# Patient Record
Sex: Female | Born: 1980
Health system: Southern US, Community
[De-identification: ages and names within clinical notes are randomized; demographics above are authoritative.]

## PROBLEM LIST (undated history)

## (undated) DIAGNOSIS — R51 Headache: Secondary | ICD-10-CM

## (undated) DIAGNOSIS — E236 Other disorders of pituitary gland: Secondary | ICD-10-CM

## (undated) DIAGNOSIS — K819 Cholecystitis, unspecified: Secondary | ICD-10-CM

## (undated) DIAGNOSIS — Z789 Other specified health status: Secondary | ICD-10-CM

## (undated) DIAGNOSIS — K219 Gastro-esophageal reflux disease without esophagitis: Secondary | ICD-10-CM

## (undated) HISTORY — PX: WISDOM TOOTH EXTRACTION: SHX21

---

## 2003-08-05 ENCOUNTER — Encounter (HOSPITAL_COMMUNITY): Admission: RE | Admit: 2003-08-05 | Discharge: 2003-09-04 | Payer: Self-pay | Admitting: Preventative Medicine

## 2003-08-25 ENCOUNTER — Ambulatory Visit (HOSPITAL_COMMUNITY): Admission: RE | Admit: 2003-08-25 | Discharge: 2003-08-25 | Payer: Self-pay | Admitting: Preventative Medicine

## 2003-10-07 ENCOUNTER — Encounter: Admission: RE | Admit: 2003-10-07 | Discharge: 2003-10-07 | Payer: Self-pay | Admitting: Neurosurgery

## 2003-10-27 ENCOUNTER — Encounter: Admission: RE | Admit: 2003-10-27 | Discharge: 2003-10-27 | Payer: Self-pay | Admitting: Neurosurgery

## 2005-01-28 ENCOUNTER — Emergency Department (HOSPITAL_COMMUNITY): Admission: EM | Admit: 2005-01-28 | Discharge: 2005-01-28 | Payer: Self-pay | Admitting: Emergency Medicine

## 2005-02-05 ENCOUNTER — Ambulatory Visit (HOSPITAL_COMMUNITY): Admission: RE | Admit: 2005-02-05 | Discharge: 2005-02-05 | Payer: Self-pay | Admitting: Internal Medicine

## 2006-04-29 ENCOUNTER — Encounter (HOSPITAL_COMMUNITY): Admission: RE | Admit: 2006-04-29 | Discharge: 2006-05-29 | Payer: Self-pay | Admitting: Internal Medicine

## 2006-05-30 ENCOUNTER — Ambulatory Visit: Admission: RE | Admit: 2006-05-30 | Discharge: 2006-05-30 | Payer: Self-pay | Admitting: Internal Medicine

## 2010-08-16 ENCOUNTER — Other Ambulatory Visit (HOSPITAL_COMMUNITY): Payer: Self-pay | Admitting: Gynecology

## 2010-08-16 DIAGNOSIS — N979 Female infertility, unspecified: Secondary | ICD-10-CM

## 2010-08-23 ENCOUNTER — Ambulatory Visit (HOSPITAL_COMMUNITY)
Admission: RE | Admit: 2010-08-23 | Discharge: 2010-08-23 | Disposition: A | Discharge status: Home / Self Care | Payer: BC Managed Care – PPO | Source: Ambulatory Visit | Attending: Gynecology | Admitting: Gynecology

## 2010-08-23 DIAGNOSIS — N979 Female infertility, unspecified: Secondary | ICD-10-CM | POA: Insufficient documentation

## 2010-08-23 MED ORDER — IOHEXOL 300 MG/ML  SOLN: 10.0000 mL | Freq: Once | INTRAMUSCULAR | Status: AC | PRN

## 2010-08-23 MED ORDER — IOHEXOL 300 MG/ML  SOLN: 10.0000 mL | Freq: Once | INTRAMUSCULAR | Status: DC | PRN

## 2010-09-06 ENCOUNTER — Other Ambulatory Visit: Payer: Self-pay | Admitting: Gynecology

## 2010-09-06 DIAGNOSIS — E349 Endocrine disorder, unspecified: Secondary | ICD-10-CM

## 2010-09-11 ENCOUNTER — Ambulatory Visit
Admission: RE | Admit: 2010-09-11 | Discharge: 2010-09-11 | Disposition: A | Discharge status: Home / Self Care | Payer: BC Managed Care – PPO | Source: Ambulatory Visit | Attending: Gynecology | Admitting: Gynecology

## 2010-09-11 DIAGNOSIS — E349 Endocrine disorder, unspecified: Secondary | ICD-10-CM

## 2010-09-11 MED ORDER — GADOBENATE DIMEGLUMINE 529 MG/ML IV SOLN
10.0000 mL | Freq: Once | INTRAVENOUS | Status: AC | PRN
  Administered 2010-09-11: 10 mL via INTRAVENOUS

## 2011-02-08 ENCOUNTER — Other Ambulatory Visit: Payer: Self-pay | Admitting: Neurosurgery

## 2011-02-08 DIAGNOSIS — D497 Neoplasm of unspecified behavior of endocrine glands and other parts of nervous system: Secondary | ICD-10-CM

## 2011-02-21 ENCOUNTER — Ambulatory Visit
Admission: RE | Admit: 2011-02-21 | Discharge: 2011-02-21 | Disposition: A | Discharge status: Home / Self Care | Payer: BC Managed Care – PPO | Source: Ambulatory Visit | Attending: Neurosurgery | Admitting: Neurosurgery

## 2011-02-21 DIAGNOSIS — D497 Neoplasm of unspecified behavior of endocrine glands and other parts of nervous system: Secondary | ICD-10-CM

## 2011-02-21 MED ORDER — GADOBENATE DIMEGLUMINE 529 MG/ML IV SOLN
10.0000 mL | Freq: Once | INTRAVENOUS | Status: AC | PRN
  Administered 2011-02-21: 10 mL via INTRAVENOUS

## 2011-05-29 LAB — OB RESULTS CONSOLE HEPATITIS B SURFACE ANTIGEN: Hepatitis B Surface Ag: NEGATIVE

## 2011-05-29 LAB — OB RESULTS CONSOLE ANTIBODY SCREEN: Antibody Screen: NEGATIVE

## 2011-05-29 LAB — OB RESULTS CONSOLE ABO/RH

## 2011-05-29 LAB — OB RESULTS CONSOLE GC/CHLAMYDIA
Chlamydia: NEGATIVE
Gonorrhea: NEGATIVE

## 2011-12-03 LAB — OB RESULTS CONSOLE GBS: GBS: NEGATIVE

## 2011-12-19 ENCOUNTER — Encounter (HOSPITAL_COMMUNITY): Payer: Self-pay | Admitting: Anesthesiology

## 2011-12-19 ENCOUNTER — Encounter (HOSPITAL_COMMUNITY)
Admission: AD | Disposition: A | Discharge status: Home / Self Care | Payer: Self-pay | Source: Ambulatory Visit | Attending: Obstetrics and Gynecology

## 2011-12-19 ENCOUNTER — Encounter (HOSPITAL_COMMUNITY): Payer: Self-pay | Admitting: *Deleted

## 2011-12-19 ENCOUNTER — Inpatient Hospital Stay (HOSPITAL_COMMUNITY)
Admission: AD | Admit: 2011-12-19 | Discharge: 2011-12-22 | DRG: 766 | Disposition: A | Discharge status: Home / Self Care | Payer: 59 | Source: Ambulatory Visit | Attending: Obstetrics and Gynecology | Admitting: Obstetrics and Gynecology

## 2011-12-19 ENCOUNTER — Inpatient Hospital Stay (HOSPITAL_COMMUNITY): Payer: 59 | Admitting: Anesthesiology

## 2011-12-19 HISTORY — DX: Other specified health status: Z78.9

## 2011-12-19 LAB — RUBELLA SCREEN: Rubella: 115.8 IU/mL — ABNORMAL HIGH

## 2011-12-19 LAB — CBC
Platelets: 216 10*3/uL (ref 150–400)
RBC: 3.88 MIL/uL (ref 3.87–5.11)
RDW: 14.6 % (ref 11.5–15.5)
WBC: 8.5 10*3/uL (ref 4.0–10.5)

## 2011-12-19 SURGERY — Surgical Case
Anesthesia: Regional

## 2011-12-19 MED ORDER — ONDANSETRON HCL 4 MG/2ML IJ SOLN
INTRAMUSCULAR | Status: AC
  Filled 2011-12-19: qty 2

## 2011-12-19 MED ORDER — LACTATED RINGERS IV SOLN
500.0000 mL | Freq: Once | INTRAVENOUS | Status: AC
  Administered 2011-12-19: 15:00:00 via INTRAVENOUS

## 2011-12-19 MED ORDER — CITRIC ACID-SODIUM CITRATE 334-500 MG/5ML PO SOLN
30.0000 mL | ORAL | Status: DC | PRN
  Administered 2011-12-19: 30 mL via ORAL
  Filled 2011-12-19: qty 15

## 2011-12-19 MED ORDER — SODIUM BICARBONATE 8.4 % IV SOLN
INTRAVENOUS | Status: AC
  Filled 2011-12-19: qty 50

## 2011-12-19 MED ORDER — EPHEDRINE 5 MG/ML INJ: 10.0000 mg | INTRAVENOUS | Status: DC | PRN

## 2011-12-19 MED ORDER — IBUPROFEN 600 MG PO TABS: 600.0000 mg | ORAL_TABLET | Freq: Four times a day (QID) | ORAL | Status: DC | PRN

## 2011-12-19 MED ORDER — OXYTOCIN 40 UNITS IN LACTATED RINGERS INFUSION - SIMPLE MED: 62.5000 mL/h | INTRAVENOUS | Status: DC

## 2011-12-19 MED ORDER — DIPHENHYDRAMINE HCL 50 MG/ML IJ SOLN: 12.5000 mg | INTRAMUSCULAR | Status: DC | PRN

## 2011-12-19 MED ORDER — DIPHENHYDRAMINE HCL 50 MG/ML IJ SOLN: 25.0000 mg | INTRAMUSCULAR | Status: DC | PRN

## 2011-12-19 MED ORDER — ACETAMINOPHEN 10 MG/ML IV SOLN
1000.0000 mg | Freq: Four times a day (QID) | INTRAVENOUS | Status: DC | PRN
  Filled 2011-12-19: qty 100

## 2011-12-19 MED ORDER — TERBUTALINE SULFATE 1 MG/ML IJ SOLN: 0.2500 mg | Freq: Once | INTRAMUSCULAR | Status: DC | PRN

## 2011-12-19 MED ORDER — EPHEDRINE 5 MG/ML INJ
INTRAVENOUS | Status: AC
  Filled 2011-12-19: qty 10

## 2011-12-19 MED ORDER — LACTATED RINGERS IV SOLN
INTRAVENOUS | Status: DC | PRN
  Administered 2011-12-19: 23:00:00 via INTRAVENOUS

## 2011-12-19 MED ORDER — OXYTOCIN 10 UNIT/ML IJ SOLN
40.0000 [IU] | INTRAVENOUS | Status: DC | PRN
  Administered 2011-12-19: 40 [IU] via INTRAVENOUS

## 2011-12-19 MED ORDER — OXYTOCIN BOLUS FROM INFUSION
500.0000 mL | INTRAVENOUS | Status: DC
  Filled 2011-12-19 (×57): qty 500

## 2011-12-19 MED ORDER — NALOXONE HCL 0.4 MG/ML IJ SOLN: 0.4000 mg | INTRAMUSCULAR | Status: DC | PRN

## 2011-12-19 MED ORDER — MEPERIDINE HCL 25 MG/ML IJ SOLN
INTRAMUSCULAR | Status: DC | PRN
  Administered 2011-12-19: 25 mg via INTRAVENOUS

## 2011-12-19 MED ORDER — KETOROLAC TROMETHAMINE 30 MG/ML IJ SOLN
30.0000 mg | Freq: Four times a day (QID) | INTRAMUSCULAR | Status: DC | PRN
  Administered 2011-12-20: 30 mg via INTRAVENOUS

## 2011-12-19 MED ORDER — NALBUPHINE HCL 10 MG/ML IJ SOLN
5.0000 mg | INTRAMUSCULAR | Status: DC | PRN
Start: 2011-12-19 — End: 2011-12-20
  Filled 2011-12-19: qty 1

## 2011-12-19 MED ORDER — EPHEDRINE 5 MG/ML INJ
10.0000 mg | INTRAVENOUS | Status: DC | PRN
  Filled 2011-12-19: qty 4

## 2011-12-19 MED ORDER — MORPHINE SULFATE (PF) 0.5 MG/ML IJ SOLN
INTRAMUSCULAR | Status: DC | PRN
  Administered 2011-12-19: 4 mg via EPIDURAL

## 2011-12-19 MED ORDER — MEPERIDINE HCL 25 MG/ML IJ SOLN: 6.2500 mg | INTRAMUSCULAR | Status: DC | PRN

## 2011-12-19 MED ORDER — FENTANYL CITRATE 0.05 MG/ML IJ SOLN: 25.0000 ug | INTRAMUSCULAR | Status: DC | PRN

## 2011-12-19 MED ORDER — SCOPOLAMINE 1 MG/3DAYS TD PT72
1.0000 | MEDICATED_PATCH | Freq: Once | TRANSDERMAL | Status: DC
  Administered 2011-12-20: 1.5 mg via TRANSDERMAL

## 2011-12-19 MED ORDER — FENTANYL 2.5 MCG/ML BUPIVACAINE 1/10 % EPIDURAL INFUSION (WH - ANES)
INTRAMUSCULAR | Status: DC | PRN
  Administered 2011-12-19: 14 mL/h via EPIDURAL

## 2011-12-19 MED ORDER — SODIUM BICARBONATE 8.4 % IV SOLN
INTRAVENOUS | Status: DC | PRN
  Administered 2011-12-19: 5 mL via EPIDURAL

## 2011-12-19 MED ORDER — PROMETHAZINE HCL 25 MG/ML IJ SOLN: 6.2500 mg | INTRAMUSCULAR | Status: DC | PRN

## 2011-12-19 MED ORDER — KETOROLAC TROMETHAMINE 30 MG/ML IJ SOLN: 30.0000 mg | Freq: Four times a day (QID) | INTRAMUSCULAR | Status: DC | PRN

## 2011-12-19 MED ORDER — CEFAZOLIN SODIUM-DEXTROSE 2-3 GM-% IV SOLR
INTRAVENOUS | Status: DC | PRN
  Administered 2011-12-19: 2 g via INTRAVENOUS

## 2011-12-19 MED ORDER — OXYCODONE-ACETAMINOPHEN 5-325 MG PO TABS: 1.0000 | ORAL_TABLET | ORAL | Status: DC | PRN

## 2011-12-19 MED ORDER — METOCLOPRAMIDE HCL 5 MG/ML IJ SOLN
10.0000 mg | Freq: Three times a day (TID) | INTRAMUSCULAR | Status: DC | PRN
Start: 2011-12-19 — End: 2011-12-20

## 2011-12-19 MED ORDER — OXYTOCIN 40 UNITS IN LACTATED RINGERS INFUSION - SIMPLE MED
1.0000 m[IU]/min | INTRAVENOUS | Status: DC
  Administered 2011-12-19: 26 m[IU]/min via INTRAVENOUS
  Administered 2011-12-19: 2 m[IU]/min via INTRAVENOUS
  Filled 2011-12-19: qty 1000

## 2011-12-19 MED ORDER — MORPHINE SULFATE 0.5 MG/ML IJ SOLN
INTRAMUSCULAR | Status: AC
  Filled 2011-12-19: qty 10

## 2011-12-19 MED ORDER — LACTATED RINGERS IV SOLN
INTRAVENOUS | Status: DC
  Administered 2011-12-19 (×2): via INTRAVENOUS

## 2011-12-19 MED ORDER — ONDANSETRON HCL 4 MG/2ML IJ SOLN: 4.0000 mg | Freq: Four times a day (QID) | INTRAMUSCULAR | Status: DC | PRN

## 2011-12-19 MED ORDER — MEPERIDINE HCL 25 MG/ML IJ SOLN
INTRAMUSCULAR | Status: AC
  Filled 2011-12-19: qty 1

## 2011-12-19 MED ORDER — ONDANSETRON HCL 4 MG/2ML IJ SOLN: 4.0000 mg | Freq: Three times a day (TID) | INTRAMUSCULAR | Status: DC | PRN

## 2011-12-19 MED ORDER — ACETAMINOPHEN 325 MG PO TABS: 650.0000 mg | ORAL_TABLET | ORAL | Status: DC | PRN

## 2011-12-19 MED ORDER — ONDANSETRON HCL 4 MG/2ML IJ SOLN
INTRAMUSCULAR | Status: DC | PRN
  Administered 2011-12-19: 4 mg via INTRAVENOUS

## 2011-12-19 MED ORDER — DIPHENHYDRAMINE HCL 25 MG PO CAPS
25.0000 mg | ORAL_CAPSULE | ORAL | Status: DC | PRN
  Filled 2011-12-19: qty 1

## 2011-12-19 MED ORDER — PHENYLEPHRINE 40 MCG/ML (10ML) SYRINGE FOR IV PUSH (FOR BLOOD PRESSURE SUPPORT)
80.0000 ug | PREFILLED_SYRINGE | INTRAVENOUS | Status: DC | PRN
  Filled 2011-12-19: qty 5

## 2011-12-19 MED ORDER — SODIUM CHLORIDE 0.9 % IV SOLN
1.0000 ug/kg/h | INTRAVENOUS | Status: DC | PRN
  Filled 2011-12-19: qty 2.5

## 2011-12-19 MED ORDER — PHENYLEPHRINE 40 MCG/ML (10ML) SYRINGE FOR IV PUSH (FOR BLOOD PRESSURE SUPPORT): 80.0000 ug | PREFILLED_SYRINGE | INTRAVENOUS | Status: DC | PRN

## 2011-12-19 MED ORDER — MIDAZOLAM HCL 2 MG/2ML IJ SOLN: 0.5000 mg | Freq: Once | INTRAMUSCULAR | Status: AC | PRN

## 2011-12-19 MED ORDER — SODIUM CHLORIDE 0.9 % IJ SOLN: 3.0000 mL | INTRAMUSCULAR | Status: DC | PRN

## 2011-12-19 MED ORDER — OXYTOCIN 10 UNIT/ML IJ SOLN
INTRAMUSCULAR | Status: AC
  Filled 2011-12-19: qty 1

## 2011-12-19 MED ORDER — PHENYLEPHRINE 40 MCG/ML (10ML) SYRINGE FOR IV PUSH (FOR BLOOD PRESSURE SUPPORT)
PREFILLED_SYRINGE | INTRAVENOUS | Status: AC
  Filled 2011-12-19: qty 5

## 2011-12-19 MED ORDER — LIDOCAINE HCL (PF) 1 % IJ SOLN: 30.0000 mL | INTRAMUSCULAR | Status: DC | PRN

## 2011-12-19 MED ORDER — CEFAZOLIN SODIUM-DEXTROSE 2-3 GM-% IV SOLR: 2.0000 g | INTRAVENOUS | Status: DC

## 2011-12-19 MED ORDER — LIDOCAINE-EPINEPHRINE (PF) 2 %-1:200000 IJ SOLN
INTRAMUSCULAR | Status: AC
  Filled 2011-12-19: qty 20

## 2011-12-19 MED ORDER — LACTATED RINGERS IV SOLN: 500.0000 mL | INTRAVENOUS | Status: DC | PRN

## 2011-12-19 MED ORDER — FENTANYL 2.5 MCG/ML BUPIVACAINE 1/10 % EPIDURAL INFUSION (WH - ANES)
14.0000 mL/h | INTRAMUSCULAR | Status: DC
  Filled 2011-12-19: qty 125

## 2011-12-19 SURGICAL SUPPLY — 31 items
BARRIER ADHS 3X4 INTERCEED (GAUZE/BANDAGES/DRESSINGS) IMPLANT
BRR ADH 4X3 ABS CNTRL BYND (GAUZE/BANDAGES/DRESSINGS)
CLOTH BEACON ORANGE TIMEOUT ST (SAFETY) ×2 IMPLANT
CONTAINER PREFILL 10% NBF 15ML (MISCELLANEOUS) IMPLANT
DRAPE SURG 17X23 STRL (DRAPES) ×2 IMPLANT
DRSG COVADERM 4X10 (GAUZE/BANDAGES/DRESSINGS) ×1 IMPLANT
DURAPREP 26ML APPLICATOR (WOUND CARE) ×2 IMPLANT
ELECT REM PT RETURN 9FT ADLT (ELECTROSURGICAL) ×2
ELECTRODE REM PT RTRN 9FT ADLT (ELECTROSURGICAL) ×1 IMPLANT
EXTRACTOR VACUUM M CUP 4 TUBE (SUCTIONS) IMPLANT
GLOVE BIO SURGEON STRL SZ 6.5 (GLOVE) ×4 IMPLANT
GOWN PREVENTION PLUS LG XLONG (DISPOSABLE) ×6 IMPLANT
KIT ABG SYR 3ML LUER SLIP (SYRINGE) IMPLANT
NDL HYPO 25X5/8 SAFETYGLIDE (NEEDLE) ×1 IMPLANT
NEEDLE HYPO 22GX1.5 SAFETY (NEEDLE) IMPLANT
NEEDLE HYPO 25X5/8 SAFETYGLIDE (NEEDLE) ×2 IMPLANT
NS IRRIG 1000ML POUR BTL (IV SOLUTION) ×2 IMPLANT
PACK C SECTION WH (CUSTOM PROCEDURE TRAY) ×2 IMPLANT
PAD OB MATERNITY 4.3X12.25 (PERSONAL CARE ITEMS) IMPLANT
SLEEVE SCD COMPRESS KNEE MED (MISCELLANEOUS) IMPLANT
STAPLER VISISTAT 35W (STAPLE) IMPLANT
SUT CHROMIC 0 CTX 36 (SUTURE) ×4 IMPLANT
SUT PLAIN 0 NONE (SUTURE) IMPLANT
SUT PLAIN 2 0 XLH (SUTURE) IMPLANT
SUT VIC AB 0 CT1 27 (SUTURE) ×6
SUT VIC AB 0 CT1 27XBRD ANBCTR (SUTURE) ×3 IMPLANT
SUT VIC AB 4-0 KS 27 (SUTURE) IMPLANT
SYR CONTROL 10ML LL (SYRINGE) IMPLANT
TOWEL OR 17X24 6PK STRL BLUE (TOWEL DISPOSABLE) ×4 IMPLANT
TRAY FOLEY CATH 14FR (SET/KITS/TRAYS/PACK) ×2 IMPLANT
WATER STERILE IRR 1000ML POUR (IV SOLUTION) ×2 IMPLANT

## 2011-12-19 NOTE — Progress Notes (Signed)
Patient is in good labor pattern  Over 200 [place] units Cervix is unchanged for past 10 hours  Recommend Primary LTCS Risks reviewed with the patient Consent signed.

## 2011-12-19 NOTE — Transfer of Care (Signed)
Immediate Anesthesia Transfer of Care Note  Patient: Pamela Mathis  Procedure(s) Performed: Procedure(s) (LRB) with comments: CESAREAN SECTION (N/A)  Patient Location: PACU  Anesthesia Type: Epidural  Level of Consciousness: awake, alert , oriented and patient cooperative  Airway & Oxygen Therapy: Patient Spontanous Breathing  Post-op Assessment: Report given to PACU RN, Post -op Vital signs reviewed and stable and Patient moving all extremities  Post vital signs: Reviewed and stable  Complications: No apparent anesthesia complications

## 2011-12-19 NOTE — MAU Note (Signed)
Reports leaking clear fluid, still coming, started at 0715.  No pain, little blood streaking. No problems with preg.

## 2011-12-19 NOTE — Brief Op Note (Signed)
12/19/2011  11:29 PM  PATIENT:  Pamela Mathis  31 y.o. female  PRE-OPERATIVE DIAGNOSIS:  Failure to Progress, IUP at 38 w 4 days  POST-OPERATIVE DIAGNOSIS:  Failure to Progress, IUP at 38 w 4 days  PROCEDURE:  Procedure(s) (LRB) with comments: CESAREAN SECTION (N/A) Primary Low Transverse SURGEON:  Surgeon(s) and Role:    * Jeani Hawking, MD - Primary  PHYSICIAN ASSISTANT:   ASSISTANTS: none   ANESTHESIA:   epidural  EBL:  Total I/O In: -  Out: 300 [Urine:300]  BLOOD ADMINISTERED:none  DRAINS: Urinary Catheter (Foley)   LOCAL MEDICATIONS USED:  NONE  SPECIMEN:  No Specimen  DISPOSITION OF SPECIMEN:  N/A  COUNTS:  YES  TOURNIQUET:  * No tourniquets in log *  DICTATION: .Other Dictation: Dictation Number [id number]  PLAN OF CARE: Admit to inpatient   PATIENT DISPOSITION:  PACU - hemodynamically stable.   Delay start of Pharmacological VTE agent (>24hrs) due to surgical blood loss or risk of bleeding: not applicable

## 2011-12-19 NOTE — Anesthesia Procedure Notes (Signed)

## 2011-12-19 NOTE — Anesthesia Preprocedure Evaluation (Signed)
Anesthesia Evaluation  Patient identified by MRN, date of birth, ID band Patient awake    Reviewed: Allergy & Precautions, H&P , Patient's Chart, lab work & pertinent test results  Airway Mallampati: II  TM Distance: >3 FB Neck ROM: full    Dental  (+) Teeth Intact   Pulmonary  breath sounds clear to auscultation        Cardiovascular Rhythm:regular Rate:Normal     Neuro/Psych    GI/Hepatic GERD-  Medicated,  Endo/Other  Morbid obesity  Renal/GU      Musculoskeletal   Abdominal   Peds  Hematology   Anesthesia Other Findings       Reproductive/Obstetrics (+) Pregnancy                             Anesthesia Physical Anesthesia Plan  ASA: III  Anesthesia Plan: Epidural   Post-op Pain Management:    Induction:   Airway Management Planned:   Additional Equipment:   Intra-op Plan:   Post-operative Plan:   Informed Consent: I have reviewed the patients History and Physical, chart, labs and discussed the procedure including the risks, benefits and alternatives for the proposed anesthesia with the patient or authorized representative who has indicated his/her understanding and acceptance.   Dental Advisory Given  Plan Discussed with:   Anesthesia Plan Comments: (Labs checked- platelets confirmed with RN in room. Fetal heart tracing, per RN, reported to be stable enough for sitting procedure. Discussed epidural, and patient consents to the procedure:  included risk of possible headache,backache, failed block, allergic reaction, and nerve injury. This patient was asked if she had any questions or concerns before the procedure started.)        Anesthesia Quick Evaluation  

## 2011-12-19 NOTE — H&P (Signed)
31 year old G 1 P 0 at term presents with srom at 0715.  PNC uncomplicated See her [place] GBBS is negative  Afebrile Vital signs stable General alert and oriented Lung CTAB Car RRR Abdomen is soft and non tender Cervix is 2 cm dilated and vertex SROM confirmed by L and D nurse  Impression: Srom  PLAN: Admit Pitocin augmentation

## 2011-12-20 ENCOUNTER — Encounter (HOSPITAL_COMMUNITY): Payer: Self-pay | Admitting: *Deleted

## 2011-12-20 LAB — CBC
Hemoglobin: 8 g/dL — ABNORMAL LOW (ref 12.0–15.0)
MCH: 27.1 pg (ref 26.0–34.0)
Platelets: 157 10*3/uL (ref 150–400)
RBC: 2.95 MIL/uL — ABNORMAL LOW (ref 3.87–5.11)
WBC: 11.3 10*3/uL — ABNORMAL HIGH (ref 4.0–10.5)

## 2011-12-20 MED ORDER — DIBUCAINE 1 % RE OINT: 1.0000 "application " | TOPICAL_OINTMENT | RECTAL | Status: DC | PRN

## 2011-12-20 MED ORDER — SIMETHICONE 80 MG PO CHEW: 80.0000 mg | CHEWABLE_TABLET | ORAL | Status: DC | PRN

## 2011-12-20 MED ORDER — ONDANSETRON HCL 4 MG/2ML IJ SOLN: 4.0000 mg | INTRAMUSCULAR | Status: DC | PRN

## 2011-12-20 MED ORDER — MENTHOL 3 MG MT LOZG: 1.0000 | LOZENGE | OROMUCOSAL | Status: DC | PRN

## 2011-12-20 MED ORDER — OXYTOCIN 40 UNITS IN LACTATED RINGERS INFUSION - SIMPLE MED: 62.5000 mL/h | INTRAVENOUS | Status: AC

## 2011-12-20 MED ORDER — TETANUS-DIPHTH-ACELL PERTUSSIS 5-2.5-18.5 LF-MCG/0.5 IM SUSP: 0.5000 mL | Freq: Once | INTRAMUSCULAR | Status: DC

## 2011-12-20 MED ORDER — SENNOSIDES-DOCUSATE SODIUM 8.6-50 MG PO TABS
2.0000 | ORAL_TABLET | Freq: Every day | ORAL | Status: DC
  Administered 2011-12-20 – 2011-12-21 (×2): 2 via ORAL

## 2011-12-20 MED ORDER — PRENATAL MULTIVITAMIN CH
1.0000 | ORAL_TABLET | Freq: Every day | ORAL | Status: DC
  Administered 2011-12-20 – 2011-12-22 (×3): 1 via ORAL
  Filled 2011-12-20 (×3): qty 1

## 2011-12-20 MED ORDER — SIMETHICONE 80 MG PO CHEW
80.0000 mg | CHEWABLE_TABLET | Freq: Three times a day (TID) | ORAL | Status: DC
  Administered 2011-12-20 – 2011-12-22 (×8): 80 mg via ORAL

## 2011-12-20 MED ORDER — LACTATED RINGERS IV BOLUS (SEPSIS): 500.0000 mL | Freq: Once | INTRAVENOUS | Status: DC

## 2011-12-20 MED ORDER — IBUPROFEN 600 MG PO TABS
600.0000 mg | ORAL_TABLET | Freq: Four times a day (QID) | ORAL | Status: DC
  Administered 2011-12-20 – 2011-12-22 (×10): 600 mg via ORAL
  Filled 2011-12-20 (×10): qty 1

## 2011-12-20 MED ORDER — OXYCODONE-ACETAMINOPHEN 5-325 MG PO TABS: 1.0000 | ORAL_TABLET | ORAL | Status: DC | PRN

## 2011-12-20 MED ORDER — LANOLIN HYDROUS EX OINT: 1.0000 "application " | TOPICAL_OINTMENT | CUTANEOUS | Status: DC | PRN

## 2011-12-20 MED ORDER — ZOLPIDEM TARTRATE 5 MG PO TABS: 5.0000 mg | ORAL_TABLET | Freq: Every evening | ORAL | Status: DC | PRN

## 2011-12-20 MED ORDER — KETOROLAC TROMETHAMINE 30 MG/ML IJ SOLN
INTRAMUSCULAR | Status: AC
  Filled 2011-12-20: qty 1

## 2011-12-20 MED ORDER — WITCH HAZEL-GLYCERIN EX PADS: 1.0000 "application " | MEDICATED_PAD | CUTANEOUS | Status: DC | PRN

## 2011-12-20 MED ORDER — LACTATED RINGERS IV SOLN
INTRAVENOUS | Status: DC
  Administered 2011-12-20 (×2): via INTRAVENOUS

## 2011-12-20 MED ORDER — SCOPOLAMINE 1 MG/3DAYS TD PT72
MEDICATED_PATCH | TRANSDERMAL | Status: AC
  Filled 2011-12-20: qty 1

## 2011-12-20 MED ORDER — DIPHENHYDRAMINE HCL 25 MG PO CAPS
25.0000 mg | ORAL_CAPSULE | Freq: Four times a day (QID) | ORAL | Status: DC | PRN
  Administered 2011-12-21: 25 mg via ORAL
  Filled 2011-12-20: qty 1

## 2011-12-20 MED ORDER — ONDANSETRON HCL 4 MG PO TABS: 4.0000 mg | ORAL_TABLET | ORAL | Status: DC | PRN

## 2011-12-20 NOTE — Addendum Note (Signed)
Addendum  created 12/20/11 0803 by Jhonnie Garner, CRNA   Modules edited:Notes Section

## 2011-12-20 NOTE — Progress Notes (Signed)
Patient output 85ml in 3.5 hours. Dr. Vincente Poli notified by telephone. Orders received for LR bolus.

## 2011-12-20 NOTE — Progress Notes (Signed)
Post Partum Day 1 Subjective: no complaints and tolerating PO  Objective: Blood pressure 101/54, pulse 75, temperature 97.6 F (36.4 C), temperature source Oral, resp. rate 20, height 5\' 6"  (1.676 m), weight 111.131 kg (245 lb), last menstrual period 08/13/2010, SpO2 97.00%, unknown if currently breastfeeding.  Physical Exam:  General: alert and cooperative Lochia: appropriate Uterine Fundus: firm Incision: abd dressing CDI DVT Evaluation: No evidence of DVT seen on physical exam. No significant calf/ankle edema. Urine output slightly low, urine clear  Basename 12/20/11 0530 12/19/11 1045  HGB 8.0* 10.4*  HCT 24.3* 32.0*    Assessment/Plan: S/p LTCS  continue IV fluids  And encourage po fluids  LOS: 1 day   Marla Pouliot G 12/20/2011, 8:22 AM

## 2011-12-20 NOTE — Anesthesia Postprocedure Evaluation (Signed)
Anesthesia Post Note  Patient: Pamela Mathis  Procedure(s) Performed: Procedure(s) (LRB): CESAREAN SECTION (N/A)  Anesthesia type: Epidural  Patient location: Mother/Baby  Post pain: Pain level controlled  Post assessment: Post-op Vital signs reviewed  Last Vitals:  Filed Vitals:   12/20/11 0609  BP: 101/54  Pulse: 75  Temp: 36.4 C  Resp:     Post vital signs: Reviewed  Level of consciousness: awake  Complications: No apparent anesthesia complications

## 2011-12-20 NOTE — Op Note (Signed)
Pamela Mathis, Pamela Mathis                ACCOUNT NO.:  [id number]  MEDICAL RECORD NO.:  [id number]  LOCATION:  9143                          FACILITY:  [facility]  PHYSICIAN:  Lior Hoen L. Kartier Bennison, M.D.DATE OF BIRTH:  1980/12/31  DATE OF PROCEDURE:  12/19/2011 DATE OF DISCHARGE:                              OPERATIVE REPORT   PREOPERATIVE DIAGNOSIS:  Intrauterine pregnancy at 84 and 4, and failure to progress.  POSTOPERATIVE DIAGNOSIS:  Intrauterine pregnancy at 38 and 4, and failure to progress.  PROCEDURE:  Primary low transverse cesarean section.  SURGEON:  Danitra Payano L. Vincente Poli, M.D.  ANESTHESIA:  Epidural.  EBL:  Less than 500 mL.  COMPLICATIONS:  None.  PROCEDURE:  The patient was taken to the operating room.  She was then prepped and draped.  A Foley catheter had been inserted while on labor and delivery.  A low transverse incision was made, carried down to the fascia.  Fascia scored in the midline and extended laterally.  Rectus muscles were separated in the midline.  The peritoneum was entered bluntly.  The peritoneal incision was then stretched.  The bladder blade was inserted.  The lower uterine segment was identified and the bladder flap was created sharply and then digitally.  The bladder blade was then readjusted.  A low transverse incision was made in the uterus.  The uterus was entered using a hemostat.  Amniotic fluid was clear.  The baby was in cephalic presentation, was delivered easily with a 1 gentle pull of the vacuum without a pop off.  The baby was a female infant and was vigorous in the OR.  The neonatal team was present.  After the cord was clamped and cut, the placenta was manually removed, noted to be normal intact with a three-vessel cord.  The uterus was exteriorized and cleared of all clots and debris.  The adnexa were normal.  The uterine incision was closed in 1 layer using 0 chromic in a running locked stitch.  Hemostasis was excellent.  Uterus was returned  to the abdomen. Irrigation performed.  Hemostasis was again noted.  The peritoneum was closed using 0 Vicryl.  The rectus muscles were closed using 0 Vicryl. The fascia was closed using 0 Vicryl running stitch.  After irrigation, subcutaneous layer and noting hemostasis, the skin was closed with staples.  All sponge, lap, and instrument counts were correct x2.  The patient went to recovery room in stable condition.    Sharvil Hoey L. Vincente Poli, M.D.    Florestine Avers  D:  12/19/2011  T:  12/20/2011  Job:  [id number]

## 2011-12-20 NOTE — Anesthesia Postprocedure Evaluation (Signed)
Anesthesia Post Note  Patient: Pamela Mathis  Procedure(s) Performed: Procedure(s) (LRB): CESAREAN SECTION (N/A)  Anesthesia type: Epidural  Patient location: PACU  Post pain: Pain level controlled  Post assessment: Post-op Vital signs reviewed  Last Vitals:  Filed Vitals:   12/19/11 2349  BP: 113/53  Pulse: 88  Temp: 37.2 C  Resp: 20    Post vital signs: Reviewed  Level of consciousness: awake  Complications: No apparent anesthesia complications

## 2011-12-21 ENCOUNTER — Encounter (HOSPITAL_COMMUNITY): Payer: Self-pay | Admitting: *Deleted

## 2011-12-21 NOTE — Progress Notes (Signed)
Subjective: Postpartum Day 2: Cesarean Delivery Patient reports incisional pain, tolerating PO, + flatus and no problems voiding.    Objective: Vital signs in last 24 hours: Temp:  [97.3 F (36.3 C)-98.3 F (36.8 C)] 97.6 F (36.4 C) (10/26 0623) Pulse Rate:  [67-85] 83  (10/26 0623) Resp:  [18-20] 18  (10/26 0623) BP: (84-106)/(44-68) 98/63 mmHg (10/26 0623) SpO2:  [96 %-98 %] 98 % (10/25 1750)  Physical Exam:  General: alert and cooperative Lochia: appropriate Uterine Fundus: firm Incision: healing well, no significant drainage DVT Evaluation: No evidence of DVT seen on physical exam.   Basename 12/20/11 0530 12/19/11 1045  HGB 8.0* 10.4*  HCT 24.3* 32.0*    Assessment/Plan: Status post Cesarean section. Doing well postoperatively.  Continue current care.  Gabbie Marzo 12/21/2011, 9:59 AM

## 2011-12-22 MED ORDER — IBUPROFEN 600 MG PO TABS: 600.0000 mg | ORAL_TABLET | Freq: Four times a day (QID) | ORAL | Status: DC

## 2011-12-22 MED ORDER — OXYCODONE-ACETAMINOPHEN 5-325 MG PO TABS: 1.0000 | ORAL_TABLET | ORAL | Status: DC | PRN

## 2011-12-22 NOTE — Discharge Summary (Signed)
Obstetric Discharge Summary Reason for Admission: rupture of membranes Prenatal Procedures: ultrasound Intrapartum Procedures: cesarean: low cervical, transverse Postpartum Procedures: none Complications-Operative and Postpartum: none Hemoglobin  Date Value Range Status  12/20/2011 8.0* 12.0 - 15.0 g/dL Final     REPEATED TO VERIFY     DELTA CHECK NOTED     HCT  Date Value Range Status  12/20/2011 24.3* 36.0 - 46.0 % Final    Physical Exam:  General: alert and cooperative Lochia: appropriate Uterine Fundus: firm Incision: healing well, no significant drainage DVT Evaluation: No evidence of DVT seen on physical exam.  Discharge Diagnoses: Term Pregnancy-delivered  Discharge Information: Date: 12/22/2011 Activity: pelvic rest Diet: routine Medications: PNV, Ibuprofen and Percocet Condition: stable Instructions: refer to practice specific booklet Discharge to: home Follow-up Information    Schedule an appointment as soon as possible for a visit in 3 days to follow up.         Newborn Data: Live born female  Birth Weight: 7 lb 2.8 oz (3255 g) APGAR: 8, 9  Home with mother.  Zyshonne Malecha 12/22/2011, 7:08 AM

## 2011-12-22 NOTE — Plan of Care (Signed)
Problem: Discharge Progression Outcomes Goal: Remove staples per MD order Outcome: Adequate for Discharge Pt. To return to office within 5 days for staple removal.

## 2011-12-23 ENCOUNTER — Encounter (HOSPITAL_COMMUNITY): Payer: Self-pay | Admitting: Obstetrics and Gynecology

## 2012-03-09 ENCOUNTER — Other Ambulatory Visit: Payer: Self-pay | Admitting: Neurosurgery

## 2012-03-09 DIAGNOSIS — D353 Benign neoplasm of craniopharyngeal duct: Secondary | ICD-10-CM

## 2012-05-11 ENCOUNTER — Other Ambulatory Visit: Payer: BC Managed Care – PPO

## 2012-05-14 ENCOUNTER — Ambulatory Visit
Admission: RE | Admit: 2012-05-14 | Discharge: 2012-05-14 | Disposition: A | Discharge status: Home / Self Care | Payer: 59 | Source: Ambulatory Visit | Attending: Neurosurgery | Admitting: Neurosurgery

## 2012-05-14 MED ORDER — GADOBENATE DIMEGLUMINE 529 MG/ML IV SOLN
10.0000 mL | Freq: Once | INTRAVENOUS | Status: AC | PRN
  Administered 2012-05-14: 10 mL via INTRAVENOUS

## 2012-08-10 DIAGNOSIS — W57XXXA Bitten or stung by nonvenomous insect and other nonvenomous arthropods, initial encounter: Secondary | ICD-10-CM | POA: Insufficient documentation

## 2012-08-13 ENCOUNTER — Emergency Department (HOSPITAL_COMMUNITY)
Admission: EM | Admit: 2012-08-13 | Discharge: 2012-08-14 | Disposition: A | Discharge status: Home / Self Care | Payer: 59 | Attending: Emergency Medicine | Admitting: Emergency Medicine

## 2012-08-13 ENCOUNTER — Encounter (HOSPITAL_COMMUNITY): Payer: Self-pay

## 2012-08-13 ENCOUNTER — Emergency Department (HOSPITAL_COMMUNITY): Payer: 59

## 2012-08-13 DIAGNOSIS — R7989 Other specified abnormal findings of blood chemistry: Secondary | ICD-10-CM | POA: Insufficient documentation

## 2012-08-13 DIAGNOSIS — R0602 Shortness of breath: Secondary | ICD-10-CM | POA: Insufficient documentation

## 2012-08-13 DIAGNOSIS — R1013 Epigastric pain: Secondary | ICD-10-CM | POA: Insufficient documentation

## 2012-08-13 DIAGNOSIS — Z79899 Other long term (current) drug therapy: Secondary | ICD-10-CM | POA: Insufficient documentation

## 2012-08-13 DIAGNOSIS — K219 Gastro-esophageal reflux disease without esophagitis: Secondary | ICD-10-CM | POA: Insufficient documentation

## 2012-08-13 HISTORY — DX: Gastro-esophageal reflux disease without esophagitis: K21.9

## 2012-08-13 LAB — CBC WITH DIFFERENTIAL/PLATELET
Basophils Absolute: 0 10*3/uL (ref 0.0–0.1)
HCT: 38.4 % (ref 36.0–46.0)
Lymphocytes Relative: 32 % (ref 12–46)
Lymphs Abs: 2 10*3/uL (ref 0.7–4.0)
Monocytes Absolute: 0.5 10*3/uL (ref 0.1–1.0)
Neutro Abs: 3.5 10*3/uL (ref 1.7–7.7)
RBC: 4.62 MIL/uL (ref 3.87–5.11)
RDW: 13.1 % (ref 11.5–15.5)
WBC: 6.2 10*3/uL (ref 4.0–10.5)

## 2012-08-13 MED ORDER — GI COCKTAIL ~~LOC~~
30.0000 mL | Freq: Once | ORAL | Status: AC
  Administered 2012-08-13: 30 mL via ORAL
  Filled 2012-08-13: qty 30

## 2012-08-13 NOTE — ED Notes (Signed)
Pt c/o epigastric pain onset tonight, states she has had this before and takes acid reflux med for same, states she took it tonight and it helped a little bit.

## 2012-08-13 NOTE — ED Provider Notes (Signed)
History  This chart was scribed for Jones Skene, MD by Manuela Schwartz, ED scribe. This patient was seen in room APA10/APA10 and the patient's care was started at 2305.   CSN: [id number]  Arrival date & time 08/13/12  2305   First MD Initiated Contact with Patient 08/13/12 2314      Chief Complaint  Patient presents with  . Chest Pain  . Abdominal Pain   Patient is a 32 y.o. female presenting with chest pain and abdominal pain. The history is provided by the patient. No language interpreter was used.  Chest Pain Pain location:  Substernal area Pain quality: dull   Pain radiates to:  L shoulder and R shoulder Pain radiates to the back: no   Associated symptoms: abdominal pain   Abdominal Pain Associated symptoms include chest pain and abdominal pain.   HPI Comments: Pamela Mathis is a 32 y.o. female who presents to the Emergency Department complaining of mild to moderate midsternal intermittent chest pain since 5 pm today which radiates to a dull ache in both her shoulders. She states this problem has been ongoing past couple of months but today was more severe. She states sx onset while sitting at home at rest. She states associated SOB during CP episodes- specifically she notes that when she takes a deep breath the pain in her epigastrium is worse. She states a hx of GERD and was not sure her sx were the same as previous times. She states has been on doxycycline recently as well for the last 2 days following a tick bite. A tick bit her about 2 weeks ago, she says it was small, it was easily removed, no is not engorged. She denies hemoptysis, emesis, fever, HA, palpitations, chills, urinary sx, vaginal d/c. There is no history of venous thromboembolic disease, patient has no history of immobilization, fracture, cancer treatments. Patient is not taking any exogenous estrogens. She is not on birth control. She is not a smoker.    Past Medical History  Diagnosis Date  . No pertinent past  medical history   . Gastroesophageal reflux     Past Surgical History  Procedure Laterality Date  . Wisdom tooth extraction    . Cesarean section  12/19/2011    Procedure: CESAREAN SECTION;  Surgeon: Jeani Hawking, MD;  Location: [facility];  Service: Obstetrics;  Laterality: N/A;    Family History  Problem Relation Age of Onset  . Other Neg Hx     History  Substance Use Topics  . Smoking status: Never Smoker   . Smokeless tobacco: Never Used  . Alcohol Use: No    OB History   Grav Para Term Preterm Abortions TAB SAB Ect Mult Living   1 1 1  0 0 0 0 0 0 1      Review of Systems  Cardiovascular: Positive for chest pain.  Gastrointestinal: Positive for abdominal pain.   At least 10 systems were reviewed and are negative except as noted in the HPI and PMH.   Allergies  Review of patient's allergies indicates no known allergies.  Home Medications   Current Outpatient Rx  Name  Route  Sig  Dispense  Refill  . calcium carbonate (TUMS - DOSED IN MG ELEMENTAL CALCIUM) 500 MG chewable tablet   Oral   Chew 2 tablets by mouth as needed. hearburn         . Multiple Vitamin (MULTIVITAMIN) capsule   Oral   Take 1 capsule by mouth daily.         Marland Kitchen  pantoprazole (PROTONIX) 40 MG tablet   Oral   Take 40 mg by mouth daily.         Marland Kitchen ibuprofen (ADVIL,MOTRIN) 600 MG tablet   Oral   Take 1 tablet (600 mg total) by mouth every 6 (six) hours.   30 tablet   0   . oxyCODONE-acetaminophen (PERCOCET/ROXICET) 5-325 MG per tablet   Oral   Take 1-2 tablets by mouth every 4 (four) hours as needed (moderate - severe pain).   30 tablet   0   . Prenatal Vit-Fe Fumarate-FA (PRENATAL MULTIVITAMIN) TABS   Oral   Take 1 tablet by mouth daily.           Triage Vitals: BP 144/94  Pulse 82  Temp(Src) 98.3 F (36.8 C) (Oral)  Resp 20  Ht 5\' 6"  (1.676 m)  Wt 223 lb (101.152 kg)  BMI 36.01 kg/m2  SpO2 96%  LMP 07/30/2012  Physical Exam  Nursing notes reviewed.   Electronic medical record reviewed. VITAL SIGNS:   Filed Vitals:   08/13/12 2321  BP: 144/94  Pulse: 82  Temp: 98.3 F (36.8 C)  TempSrc: Oral  Resp: 20  Height: 5\' 6"  (1.676 m)  Weight: 223 lb (101.152 kg)  SpO2: 96%   CONSTITUTIONAL: Awake, oriented, appears non-toxic HENT: Atraumatic, normocephalic, oral mucosa pink and moist, airway patent. Nares patent without drainage. External ears normal. EYES: Conjunctiva clear, EOMI, PERRLA NECK: Trachea midline, non-tender, supple CARDIOVASCULAR: Normal heart rate, Normal rhythm, No murmurs, rubs, gallops PULMONARY/CHEST: Clear to auscultation, no rhonchi, wheezes, or rales. Symmetrical breath sounds. Non-tender. ABDOMINAL: Non-distended, obese, soft, non-tender - no rebound or guarding.  BS normal. NEUROLOGIC: Non-focal, moving all four extremities, no gross sensory or motor deficits. EXTREMITIES: No clubbing, cyanosis, or edema SKIN: Warm, Dry, No erythema, No rash. Small insect bite to the anterior right ankle. No erythema, no pus, no streaking.  ED Course  Procedures (including critical care time) DIAGNOSTIC STUDIES: Oxygen Saturation is 96% on room air, normal by my interpretation.    COORDINATION OF CARE: At 1130 PM Discussed treatment plan with patient which includes EKG, GI cocktail, CXR, blood work, EKG. Patient agrees.   Date: 08/14/2012  Rate: 62  Rhythm: normal sinus rhythm  QRS Axis: normal  Intervals: normal  ST/T Wave abnormalities: normal  Conduction Disutrbances: none  Narrative Interpretation: unremarkable-nonischemic EKG, no prior for comparison.     Labs Reviewed  CBC WITH DIFFERENTIAL  TROPONIN I  COMPREHENSIVE METABOLIC PANEL   No results found.   No diagnosis found.    MDM  32 year old female presents with epigastric pain has been intermittent, and history of GERD, tick bite currently taking doxycycline, no hypertension, no diabetes, no hyperlipidemia. Think the patient is extremely low risk  for acute coronary syndrome at this time. Likewise she is PERC negative I do not think she's got a pulmonary embolism. Patient's presentation is most consistent with GERD, likely exacerbated by taking doxycycline. Patient's tick bite on the right ankle is not erythematous, it does not appear infected. Do not think this patient has document spotted fever, likewise the tick was only attached for a small period time, but significant, and is very easy to remove and I do not think he was attached lung transmitted disease-she has not exhibited or manifested signs and symptoms consistent with document spotted fever such as headache, rash, myalgias.  Patient has some diffusely elevated liver function tests including AST, ALT and alkaline phosphatase, these are all low level elevations, suggestive of  possible gallbladder disease versus steatohepatitis this. There is a family history of both a low disease and steatohepatitis. Based on my clinical exam, the patient has no right upper quadrant pain, she has no vomiting - I do not suspect acute cholecystitis in this patient. She has no white count, she is nontoxic in appearance and she is afebrile. Is not tachycardic.  I discussed the patient's laboratory results with her, she will followup with her primary care physician in about a week to discuss elevated liver function tests, I told her she will likely need a formal ultrasound study. Bedside ultrasound study failed to show a clear depiction of gallbladder secondary to overlying bowel gas and patient body habitus.  Do not think this patient has a medical or surgical emergency at this time, she likely has some GI upset secondary to GERD and possibly doxycycline as well.  I told her she may hold her doxycycline, but should start taking it and be reevaluated in the emergency department for any fevers, rashes, headache or any other concerning symptoms. Patient received complete relief with GI cocktail. Doubt ACS, PE, no  evidence for pneumothorax, pneumonia, pericarditis, myocarditis. Vital signs stable and within normal limits. No evidence for pancreatitis, acute cholecystitis, biliary obstruction, viscus perforation.  Patient understands and accepts the medical plan as it's been dictated, will followup with her primary care physician or return to the emergency department should her condition deteriorate or any other new symptoms arise.  I personally performed the services described in this documentation, which was scribed in my presence. The recorded information has been reviewed and is accurate. Jones Skene, M.D.           Jones Skene, MD 08/14/12 [phone]

## 2012-08-14 LAB — COMPREHENSIVE METABOLIC PANEL
BUN: 11 mg/dL (ref 6–23)
CO2: 27 mEq/L (ref 19–32)
Calcium: 9.2 mg/dL (ref 8.4–10.5)
Creatinine, Ser: 0.66 mg/dL (ref 0.50–1.10)
GFR calc Af Amer: >90 mL/min (ref >90)
GFR calc non Af Amer: >90 mL/min (ref >90)
Glucose, Bld: 129 mg/dL — ABNORMAL HIGH (ref 70–99)

## 2012-08-14 LAB — LIPASE, BLOOD: Lipase: 39 U/L (ref 11–59)

## 2012-08-14 LAB — TROPONIN I: Troponin I: <0.3 ng/mL (ref <0.30)

## 2013-06-11 ENCOUNTER — Emergency Department (HOSPITAL_COMMUNITY): Payer: 59

## 2013-06-11 ENCOUNTER — Encounter (HOSPITAL_COMMUNITY): Payer: Self-pay | Admitting: Emergency Medicine

## 2013-06-11 ENCOUNTER — Emergency Department (HOSPITAL_COMMUNITY)
Admission: EM | Admit: 2013-06-11 | Discharge: 2013-06-11 | Disposition: A | Discharge status: Home / Self Care | Payer: 59 | Attending: Emergency Medicine | Admitting: Emergency Medicine

## 2013-06-11 DIAGNOSIS — K810 Acute cholecystitis: Secondary | ICD-10-CM | POA: Insufficient documentation

## 2013-06-11 DIAGNOSIS — R110 Nausea: Secondary | ICD-10-CM | POA: Insufficient documentation

## 2013-06-11 DIAGNOSIS — Z3202 Encounter for pregnancy test, result negative: Secondary | ICD-10-CM | POA: Insufficient documentation

## 2013-06-11 DIAGNOSIS — F411 Generalized anxiety disorder: Secondary | ICD-10-CM | POA: Insufficient documentation

## 2013-06-11 DIAGNOSIS — K819 Cholecystitis, unspecified: Secondary | ICD-10-CM

## 2013-06-11 DIAGNOSIS — IMO0002 Reserved for concepts with insufficient information to code with codable children: Secondary | ICD-10-CM | POA: Insufficient documentation

## 2013-06-11 DIAGNOSIS — R1011 Right upper quadrant pain: Secondary | ICD-10-CM | POA: Insufficient documentation

## 2013-06-11 DIAGNOSIS — K219 Gastro-esophageal reflux disease without esophagitis: Secondary | ICD-10-CM | POA: Insufficient documentation

## 2013-06-11 DIAGNOSIS — Z79899 Other long term (current) drug therapy: Secondary | ICD-10-CM | POA: Insufficient documentation

## 2013-06-11 LAB — CBC
HEMATOCRIT: 40.1 % (ref 36.0–46.0)
HEMOGLOBIN: 13.6 g/dL (ref 12.0–15.0)
MCH: 29.6 pg (ref 26.0–34.0)
MCHC: 33.9 g/dL (ref 30.0–36.0)
MCV: 87.2 fL (ref 78.0–100.0)
Platelets: 229 10*3/uL (ref 150–400)
RBC: 4.6 MIL/uL (ref 3.87–5.11)
RDW: 12.7 % (ref 11.5–15.5)
WBC: 8 10*3/uL (ref 4.0–10.5)

## 2013-06-11 LAB — URINALYSIS, ROUTINE W REFLEX MICROSCOPIC
BILIRUBIN URINE: NEGATIVE
GLUCOSE, UA: NEGATIVE mg/dL
Ketones, ur: NEGATIVE mg/dL
Leukocytes, UA: NEGATIVE
Nitrite: NEGATIVE
PROTEIN: NEGATIVE mg/dL
Specific Gravity, Urine: 1.015 (ref 1.005–1.030)
Urobilinogen, UA: 0.2 mg/dL (ref 0.0–1.0)
pH: 7.5 (ref 5.0–8.0)

## 2013-06-11 LAB — TROPONIN I

## 2013-06-11 LAB — COMPREHENSIVE METABOLIC PANEL
ALK PHOS: 181 U/L — AB (ref 39–117)
ALT: 147 U/L — AB (ref 0–35)
AST: 271 U/L — ABNORMAL HIGH (ref 0–37)
Albumin: 3.3 g/dL — ABNORMAL LOW (ref 3.5–5.2)
BUN: 10 mg/dL (ref 6–23)
CALCIUM: 9.3 mg/dL (ref 8.4–10.5)
CO2: 30 meq/L (ref 19–32)
Chloride: 100 mEq/L (ref 96–112)
Creatinine, Ser: 0.75 mg/dL (ref 0.50–1.10)
GLUCOSE: 104 mg/dL — AB (ref 70–99)
Potassium: 3.9 mEq/L (ref 3.7–5.3)
SODIUM: 140 meq/L (ref 137–147)
Total Bilirubin: 0.7 mg/dL (ref 0.3–1.2)
Total Protein: 7.2 g/dL (ref 6.0–8.3)

## 2013-06-11 LAB — URINE MICROSCOPIC-ADD ON

## 2013-06-11 LAB — LIPASE, BLOOD: Lipase: 40 U/L (ref 11–59)

## 2013-06-11 LAB — PREGNANCY, URINE: Preg Test, Ur: NEGATIVE

## 2013-06-11 MED ORDER — ONDANSETRON HCL 4 MG/2ML IJ SOLN: 4.0000 mg | Freq: Once | INTRAMUSCULAR | Status: DC

## 2013-06-11 MED ORDER — ONDANSETRON HCL 4 MG PO TABS: 4.0000 mg | ORAL_TABLET | Freq: Four times a day (QID) | ORAL | Status: DC

## 2013-06-11 MED ORDER — HYDROMORPHONE HCL PF 2 MG/ML IJ SOLN
2.0000 mg | Freq: Once | INTRAMUSCULAR | Status: AC
  Administered 2013-06-11: 2 mg via INTRAMUSCULAR
  Filled 2013-06-11: qty 1

## 2013-06-11 MED ORDER — ONDANSETRON 8 MG PO TBDP
8.0000 mg | ORAL_TABLET | Freq: Once | ORAL | Status: AC
  Administered 2013-06-11: 8 mg via ORAL
  Filled 2013-06-11: qty 1

## 2013-06-11 MED ORDER — OXYCODONE-ACETAMINOPHEN 5-325 MG PO TABS: 1.0000 | ORAL_TABLET | ORAL | Status: DC | PRN

## 2013-06-11 MED ORDER — HYDROMORPHONE HCL PF 1 MG/ML IJ SOLN: 1.0000 mg | Freq: Once | INTRAMUSCULAR | Status: DC

## 2013-06-11 NOTE — Discharge Instructions (Signed)
Call Doctor Arnoldo Morale' office on Monday to schedule appointment Tuesday. Clear liquid diet until followup. Return to the ER if you have increasing pain or fever.  Cholecystitis Cholecystitis is an inflammation of your gallbladder. It is usually caused by a buildup of gallstones or sludge (cholelithiasis) in your gallbladder. The gallbladder stores a fluid that helps digest fats (bile). Cholecystitis is serious and needs treatment right away.  CAUSES   Gallstones. Gallstones can block the tube that leads to your gallbladder, causing bile to build up. As bile builds up, the gallbladder becomes inflamed.  Bile duct problems, such as blockage from scarring or kinking.  Tumors. Tumors can stop bile from leaving your gallbladder correctly, causing bile to build up. As bile builds up, the gallbladder becomes inflamed. SYMPTOMS   Nausea.  Vomiting.  Abdominal pain, especially in the upper right area of your abdomen.  Abdominal tenderness or bloating.  Sweating.  Chills.  Fever.  Yellowing of the skin and the whites of the eyes (jaundice). DIAGNOSIS  Your caregiver may order blood tests to look for infection or gallbladder problems. Your caregiver may also order imaging tests, such as an ultrasound or computed tomography (CT) scan. Further tests may include a hepatobiliary iminodiacetic acid (HIDA) scan. This scan allows your caregiver to see your bile move from the liver to the gallbladder and to the small intestine. TREATMENT  A hospital stay is usually necessary to lessen the inflammation of your gallbladder. You may be required to not eat or drink (fast) for a certain amount of time. You may be given medicine to treat pain or an antibiotic medicine to treat an infection. Surgery may be needed to remove your gallbladder (cholecystectomy) once the inflammation has gone down. Surgery may be needed right away if you develop complications such as death of gallbladder tissue (gangrene) or a tear  (perforation) of the gallbladder.  [place] care will depend on your treatment. In general:  If you were given antibiotics, take them as directed. Finish them even if you start to feel better.  Only take over-the-counter or prescription medicines for pain, discomfort, or fever as directed by your caregiver.  Follow a low-fat diet until you see your caregiver again.  Keep all follow-up visits as directed by your caregiver. SEEK IMMEDIATE MEDICAL CARE IF:   Your pain is increasing and not controlled by medicines.  Your pain moves to another part of your abdomen or to your back.  You have a fever.  You have nausea and vomiting. MAKE SURE YOU:  Understand these instructions.  Will watch your condition.  Will get help right away if you are not doing well or get worse. Document Released: 02/11/2005 Document Revised: 05/06/2011 Document Reviewed: 12/28/2010 [facility] Patient Information 2014 [place], [place].

## 2013-06-11 NOTE — ED Notes (Addendum)
Pt co mid epigastric pain that radiates to both shoulders. Pt states the pain in her chest happened exactly the same way once last year and the dx was acid reflux.

## 2013-06-11 NOTE — ED Notes (Signed)
Pt alert & oriented x4, stable gait. Patient given discharge instructions, paperwork & prescription(s). Patient  instructed to stop at the registration desk to finish any additional paperwork. Patient verbalized understanding. Pt left department w/ no further questions. 

## 2013-06-11 NOTE — ED Provider Notes (Signed)
CSN: [id number]     Arrival date & time 06/11/13  1135 History  This chart was scribed for Orpah Greek, MD by Elby Beck, ED Scribe. This patient was seen in room APA06/[facility]06 and the patient's care was started at 1:20 PM.   Chief Complaint  Patient presents with  . Abdominal Pain    The history is provided by the patient. No language interpreter was used.    HPI Comments: Pamela Mathis is a 33 y.o. Female with a history of GERD who presents to the Emergency Department complaining of intermittent, moderate epigastric abdominal pain onset 4.5 hours ago. She reports associated nausea. She denies any episodes of emesis, however states that she took an anti-emetic at the onset of her nausea. She further reports feeling anxious today. She states that she has a history of flare-ups of similar epigastric abdominal pain every several months, that started during her pregnancy with her now 47 month old child. She reports being told her symptoms are due to GERD. She denies any other pain or symptoms.    Past Medical History  Diagnosis Date  . No pertinent past medical history   . Gastroesophageal reflux    Past Surgical History  Procedure Laterality Date  . Wisdom tooth extraction    . Cesarean section  12/19/2011    Procedure: CESAREAN SECTION;  Surgeon: Cyril Mourning, MD;  Location: [facility];  Service: Obstetrics;  Laterality: N/A;   Family History  Problem Relation Age of Onset  . Other Neg Hx    History  Substance Use Topics  . Smoking status: Never Smoker   . Smokeless tobacco: Never Used  . Alcohol Use: No   OB History   Grav Para Term Preterm Abortions TAB SAB Ect Mult Living   1 1 1  0 0 0 0 0 0 1     Review of Systems  Gastrointestinal: Positive for nausea and abdominal pain. Negative for vomiting.  Psychiatric/Behavioral: The patient is nervous/anxious.   All other systems reviewed and are negative.  Allergies  Review of patient's allergies indicates no  known allergies.  Home Medications   Prior to Admission medications   Medication Sig Start Date End Date Taking? Authorizing Provider  calcium carbonate (TUMS - DOSED IN MG ELEMENTAL CALCIUM) 500 MG chewable tablet Chew 2 tablets by mouth as needed. hearburn   Yes Historical Provider, MD  diphenhydrAMINE (BENADRYL) 25 MG tablet Take 50 mg by mouth every 6 (six) hours as needed for itching.   Yes Historical Provider, MD  HYDROcodone-acetaminophen (NORCO/VICODIN) 5-325 MG per tablet Take 1 tablet by mouth daily as needed for moderate pain.  04/19/13  Yes Historical Provider, MD  pantoprazole (PROTONIX) 40 MG tablet Take 40 mg by mouth daily.   Yes Historical Provider, MD  triamcinolone cream (KENALOG) 0.1 % Apply 1 application topically 2 (two) times daily. 05/31/13  Yes Historical Provider, MD   Triage Vitals: BP 134/73  Temp(Src) 98.6 F (37 C) (Oral)  Resp 16  Ht 5\' 6"  (1.676 m)  Wt 237 lb (107.502 kg)  BMI 38.27 kg/m2  SpO2 100%  LMP 06/07/2013  Physical Exam  Nursing note and vitals reviewed. Constitutional: She is oriented to person, place, and time. She appears well-developed and well-nourished. No distress.  HENT:  Head: Normocephalic and atraumatic.  Right Ear: Hearing normal.  Left Ear: Hearing normal.  Nose: Nose normal.  Mouth/Throat: Oropharynx is clear and moist and mucous membranes are normal.  Eyes: Conjunctivae and EOM are  normal. Pupils are equal, round, and reactive to light.  Neck: Normal range of motion. Neck supple.  Cardiovascular: Regular rhythm, S1 normal and S2 normal.  Exam reveals no gallop and no friction rub.   No murmur heard. Pulmonary/Chest: Effort normal and breath sounds normal. No respiratory distress. She exhibits no tenderness.  Abdominal: Soft. Normal appearance and bowel sounds are normal. There is no hepatosplenomegaly. There is tenderness (epigastric and mild tenderness in RUQ). There is no rebound, no guarding, no tenderness at McBurney's  point and negative Murphy's sign. No hernia.  Musculoskeletal: Normal range of motion.  Neurological: She is alert and oriented to person, place, and time. She has normal strength. No cranial nerve deficit or sensory deficit. Coordination normal. GCS eye subscore is 4. GCS verbal subscore is 5. GCS motor subscore is 6.  Skin: Skin is warm, dry and intact. No rash noted. No cyanosis.  Psychiatric: She has a normal mood and affect. Her speech is normal and behavior is normal. Thought content normal.    ED Course  Procedures (including critical care time)  DIAGNOSTIC STUDIES: Oxygen Saturation is 100% on RA, normal by my interpretation.    COORDINATION OF CARE: 1:26 PM- Discussed plan to obtain diagnostic lab work and radiology, to rule out gallbladder disease. Pt advised of plan for treatment and pt agrees.  Labs Review Labs Reviewed  URINALYSIS, ROUTINE W REFLEX MICROSCOPIC - Abnormal; Notable for the following:    Hgb urine dipstick MODERATE (*)    All other components within normal limits  COMPREHENSIVE METABOLIC PANEL - Abnormal; Notable for the following:    Glucose, Bld 104 (*)    Albumin 3.3 (*)    AST 271 (*)    ALT 147 (*)    Alkaline Phosphatase 181 (*)    All other components within normal limits  CBC  PREGNANCY, URINE  LIPASE, BLOOD  TROPONIN I  URINE MICROSCOPIC-ADD ON    Imaging Review Dg Abd Acute W/chest  06/11/2013   CLINICAL DATA:  Upper abdomen pain  EXAM: ACUTE ABDOMEN SERIES (ABDOMEN 2 VIEW & CHEST 1 VIEW)  COMPARISON:  Chest x-ray August 13, 2012  FINDINGS: There is no evidence of dilated bowel loops or free intraperitoneal air. No radiopaque calculi or other significant radiographic abnormality is seen. Heart size and mediastinal contours are within normal limits. Both lungs are clear. There is scoliosis of spine.  IMPRESSION: Negative abdominal radiographs.  No acute cardiopulmonary disease.   Electronically Signed   By: Abelardo Diesel M.D.   On: 06/11/2013  13:25   US Abdomen Limited Ruq  06/11/2013   CLINICAL DATA:  Right upper quadrant pain  EXAM: US ABDOMEN LIMITED - RIGHT UPPER QUADRANT  COMPARISON:  None.  FINDINGS: Gallbladder:  Gallstones and sludge identified within the gallbladder. The ultrasound technologist reports positive sonographic Murphy sign. The gallbladder wall is thickened measuring 4.3 mm.  Common bile duct:  Diameter: 10 mm, dilated.  Liver:  The liver is enlarged measuring 21.9 cm.  IMPRESSION: Findings suspicious for acute cholecystitis. Common bile duct dilatation.   Electronically Signed   By: Abelardo Diesel M.D.   On: 06/11/2013 14:48     EKG Interpretation None      Date: 06/11/2013  Rate: 65  Rhythm: normal sinus rhythm  QRS Axis: normal  Intervals: normal  ST/T Wave abnormalities: normal  Conduction Disutrbances: none  Narrative Interpretation: unremarkable      MDM   Final diagnoses:  None   Patient presents to the  ER for evaluation of epigastric pain which began early this morning. Pain radiates into the back. She reports that she has had intermittent episodes of this in the past, has had attributed to gastric reflux. Examination did reveal moderate tenderness in the right upper quadrant. Gallbladder disease was suspected. LFTs are mildly elevated. Ultrasound does show evidence of acute cholecystitis, as well as dilated common bile duct.  Case discussed with Doctor Arnoldo Morale. He would prefer to delay surgery by several days because of the gallbladder inflammation. He does not think that the patient would need an MRCP because she does not have elevated bilirubin. Patient is not in any discomfort currently, appears well. She is afebrile without any elevated white count. Patient was therefore administered IM Dilaudid here in the ER and will be prescribed Percocet to be used as needed for pain as an outpatient. She will be seen in his office on Tuesday the 21st. Patient counseled to return to the ER but for  worsening pain or fever.  I personally performed the services described in this documentation, which was scribed in my presence. The recorded information has been reviewed and is accurate.   Orpah Greek, MD 06/11/13 [phone]

## 2013-06-13 ENCOUNTER — Inpatient Hospital Stay (HOSPITAL_COMMUNITY)
Admission: EM | Admit: 2013-06-13 | Discharge: 2013-06-17 | DRG: 419 | Disposition: A | Discharge status: Home / Self Care | Payer: 59 | Attending: General Surgery | Admitting: General Surgery

## 2013-06-13 ENCOUNTER — Encounter (HOSPITAL_COMMUNITY): Payer: Self-pay | Admitting: Emergency Medicine

## 2013-06-13 DIAGNOSIS — K8000 Calculus of gallbladder with acute cholecystitis without obstruction: Principal | ICD-10-CM | POA: Diagnosis present

## 2013-06-13 DIAGNOSIS — K219 Gastro-esophageal reflux disease without esophagitis: Secondary | ICD-10-CM | POA: Diagnosis present

## 2013-06-13 DIAGNOSIS — K8010 Calculus of gallbladder with chronic cholecystitis without obstruction: Secondary | ICD-10-CM | POA: Diagnosis present

## 2013-06-13 DIAGNOSIS — K810 Acute cholecystitis: Secondary | ICD-10-CM | POA: Diagnosis present

## 2013-06-13 HISTORY — DX: Cholecystitis, unspecified: K81.9

## 2013-06-13 HISTORY — DX: Headache: R51

## 2013-06-13 LAB — CBC WITH DIFFERENTIAL/PLATELET
Basophils Absolute: 0.1 10*3/uL (ref 0.0–0.1)
Basophils Relative: 1 % (ref 0–1)
EOS ABS: 0.3 10*3/uL (ref 0.0–0.7)
Eosinophils Relative: 6 % — ABNORMAL HIGH (ref 0–5)
HCT: 40 % (ref 36.0–46.0)
Hemoglobin: 13.8 g/dL (ref 12.0–15.0)
Lymphocytes Relative: 33 % (ref 12–46)
Lymphs Abs: 1.9 10*3/uL (ref 0.7–4.0)
MCH: 29.9 pg (ref 26.0–34.0)
MCHC: 34.5 g/dL (ref 30.0–36.0)
MCV: 86.8 fL (ref 78.0–100.0)
MONOS PCT: 10 % (ref 3–12)
Monocytes Absolute: 0.6 10*3/uL (ref 0.1–1.0)
NEUTROS PCT: 50 % (ref 43–77)
Neutro Abs: 2.9 10*3/uL (ref 1.7–7.7)
Platelets: 218 10*3/uL (ref 150–400)
RBC: 4.61 MIL/uL (ref 3.87–5.11)
RDW: 12.7 % (ref 11.5–15.5)
WBC: 5.7 10*3/uL (ref 4.0–10.5)

## 2013-06-13 LAB — COMPREHENSIVE METABOLIC PANEL
ALBUMIN: 3.3 g/dL — AB (ref 3.5–5.2)
ALT: 930 U/L — ABNORMAL HIGH (ref 0–35)
AST: 428 U/L — ABNORMAL HIGH (ref 0–37)
Alkaline Phosphatase: 323 U/L — ABNORMAL HIGH (ref 39–117)
BUN: 8 mg/dL (ref 6–23)
CHLORIDE: 99 meq/L (ref 96–112)
CO2: 27 mEq/L (ref 19–32)
Calcium: 8.7 mg/dL (ref 8.4–10.5)
Creatinine, Ser: 0.76 mg/dL (ref 0.50–1.10)
GFR calc Af Amer: >90 mL/min (ref >90)
GFR calc non Af Amer: >90 mL/min (ref >90)
Glucose, Bld: 129 mg/dL — ABNORMAL HIGH (ref 70–99)
POTASSIUM: 3.6 meq/L — AB (ref 3.7–5.3)
Sodium: 137 mEq/L (ref 137–147)
TOTAL PROTEIN: 7.2 g/dL (ref 6.0–8.3)
Total Bilirubin: 1.8 mg/dL — ABNORMAL HIGH (ref 0.3–1.2)

## 2013-06-13 LAB — LIPASE, BLOOD: LIPASE: 30 U/L (ref 11–59)

## 2013-06-13 MED ORDER — ONDANSETRON HCL 4 MG/2ML IJ SOLN
4.0000 mg | Freq: Four times a day (QID) | INTRAMUSCULAR | Status: DC | PRN
  Administered 2013-06-13 – 2013-06-15 (×3): 4 mg via INTRAVENOUS
  Filled 2013-06-13 (×5): qty 2

## 2013-06-13 MED ORDER — DIPHENHYDRAMINE HCL 12.5 MG/5ML PO ELIX
12.5000 mg | ORAL_SOLUTION | Freq: Four times a day (QID) | ORAL | Status: DC | PRN
  Administered 2013-06-13: 25 mg via ORAL
  Administered 2013-06-14: 12.5 mg via ORAL
  Administered 2013-06-14: 25 mg via ORAL
  Filled 2013-06-13 (×3): qty 10

## 2013-06-13 MED ORDER — FENTANYL CITRATE 0.05 MG/ML IJ SOLN
50.0000 ug | Freq: Once | INTRAMUSCULAR | Status: AC
  Administered 2013-06-13: 50 ug via INTRAVENOUS
  Filled 2013-06-13: qty 2

## 2013-06-13 MED ORDER — KCL IN DEXTROSE-NACL 20-5-0.45 MEQ/L-%-% IV SOLN
INTRAVENOUS | Status: DC
  Administered 2013-06-13 – 2013-06-16 (×5): via INTRAVENOUS

## 2013-06-13 MED ORDER — ONDANSETRON HCL 4 MG/2ML IJ SOLN: 4.0000 mg | Freq: Three times a day (TID) | INTRAMUSCULAR | Status: DC | PRN

## 2013-06-13 MED ORDER — ENOXAPARIN SODIUM 40 MG/0.4ML ~~LOC~~ SOLN
40.0000 mg | SUBCUTANEOUS | Status: DC
  Administered 2013-06-13 – 2013-06-16 (×4): 40 mg via SUBCUTANEOUS
  Filled 2013-06-13 (×4): qty 0.4

## 2013-06-13 MED ORDER — SODIUM CHLORIDE 0.9 % IV SOLN
INTRAVENOUS | Status: DC
  Administered 2013-06-13: 19:00:00 via INTRAVENOUS

## 2013-06-13 MED ORDER — PANTOPRAZOLE SODIUM 40 MG PO TBEC
40.0000 mg | DELAYED_RELEASE_TABLET | Freq: Every day | ORAL | Status: DC
  Administered 2013-06-14 – 2013-06-17 (×3): 40 mg via ORAL
  Filled 2013-06-13 (×3): qty 1

## 2013-06-13 MED ORDER — DIPHENHYDRAMINE HCL 50 MG/ML IJ SOLN
12.5000 mg | Freq: Four times a day (QID) | INTRAMUSCULAR | Status: DC | PRN
  Administered 2013-06-15: 25 mg via INTRAVENOUS
  Filled 2013-06-13 (×2): qty 1

## 2013-06-13 MED ORDER — SODIUM CHLORIDE 0.9 % IV SOLN: INTRAVENOUS | Status: DC

## 2013-06-13 MED ORDER — ONDANSETRON HCL 4 MG/2ML IJ SOLN
4.0000 mg | Freq: Once | INTRAMUSCULAR | Status: AC
  Administered 2013-06-13: 4 mg via INTRAVENOUS
  Filled 2013-06-13: qty 2

## 2013-06-13 MED ORDER — FENTANYL CITRATE 0.05 MG/ML IJ SOLN
50.0000 ug | INTRAMUSCULAR | Status: DC | PRN
  Administered 2013-06-13 – 2013-06-17 (×14): 50 ug via INTRAVENOUS
  Filled 2013-06-13 (×14): qty 2

## 2013-06-13 NOTE — ED Notes (Signed)
Pt c/o RUQ pain x3-4 days. Pt was seen here of Friday and diagnosed with gallstones. Pt has follow-up scheduled with Dr. Creig Hines this week but states symptoms have worsened. Pt also reports nausea but denies vomiting.

## 2013-06-13 NOTE — H&P (Signed)
Pamela Mathis is an 33 y.o. female.   Chief Complaint: Right upper quadrant abdominal pain HPI: Patient is a 33 year old white female who presented to the emergency room 2 days ago with worsening right upper quadrant abdominal pain. At that time, she was noted to have cholelithiasis with a thickened gallbladder wall. As her symptoms had subsided, she was discharged home in order to followup with general surgery as an outpatient. She felt fine the next day. Today, she presented with worsening right upper quadrant abdominal pain and nausea. Her liver tests are now elevated with a total bilirubin of 1.8. White blood cell count is within normal limits. She is being admitted to the hospital for further evaluation treatment. She states that she has had intermittent epigastric pain for some time now after the birth of her child. She has been taking protonix as an outpatient for reflux.  Past Medical History  Diagnosis Date  . No pertinent past medical history   . Gastroesophageal reflux   . Cholecystitis     Past Surgical History  Procedure Laterality Date  . Wisdom tooth extraction    . Cesarean section  12/19/2011    Procedure: CESAREAN SECTION;  Surgeon: Cyril Mourning, MD;  Location: [facility];  Service: Obstetrics;  Laterality: N/A;    Family History  Problem Relation Age of Onset  . Other Neg Hx    Social History:  reports that she has never smoked. She has never used smokeless tobacco. She reports that she does not drink alcohol or use illicit drugs.  Allergies:  Allergies  Allergen Reactions  . Dilaudid [Hydromorphone] Nausea And Vomiting     (Not in a hospital admission)  Results for orders placed during the hospital encounter of 06/13/13 (from the past 48 hour(s))  CBC WITH DIFFERENTIAL     Status: Abnormal   Collection Time    06/13/13  6:45 PM      Result Value Ref Range   WBC 5.7  4.0 - 10.5 K/uL   RBC 4.61  3.87 - 5.11 MIL/uL   Hemoglobin 13.8  12.0 - 15.0 g/dL   HCT  40.0  36.0 - 46.0 %   MCV 86.8  78.0 - 100.0 fL   MCH 29.9  26.0 - 34.0 pg   MCHC 34.5  30.0 - 36.0 g/dL   RDW 12.7  11.5 - 15.5 %   Platelets 218  150 - 400 K/uL   Neutrophils Relative % 50  43 - 77 %   Neutro Abs 2.9  1.7 - 7.7 K/uL   Lymphocytes Relative 33  12 - 46 %   Lymphs Abs 1.9  0.7 - 4.0 K/uL   Monocytes Relative 10  3 - 12 %   Monocytes Absolute 0.6  0.1 - 1.0 K/uL   Eosinophils Relative 6 (*) 0 - 5 %   Eosinophils Absolute 0.3  0.0 - 0.7 K/uL   Basophils Relative 1  0 - 1 %   Basophils Absolute 0.1  0.0 - 0.1 K/uL  COMPREHENSIVE METABOLIC PANEL     Status: Abnormal   Collection Time    06/13/13  6:45 PM      Result Value Ref Range   Sodium 137  137 - 147 mEq/L   Potassium 3.6 (*) 3.7 - 5.3 mEq/L   Chloride 99  96 - 112 mEq/L   CO2 27  19 - 32 mEq/L   Glucose, Bld 129 (*) 70 - 99 mg/dL   BUN 8  6 -  23 mg/dL   Creatinine, Ser 0.76  0.50 - 1.10 mg/dL   Calcium 8.7  8.4 - 10.5 mg/dL   Total Protein 7.2  6.0 - 8.3 g/dL   Albumin 3.3 (*) 3.5 - 5.2 g/dL   AST 428 (*) 0 - 37 U/L   ALT 930 (*) 0 - 35 U/L   Alkaline Phosphatase 323 (*) 39 - 117 U/L   Total Bilirubin 1.8 (*) 0.3 - 1.2 mg/dL   GFR calc non Af Amer >90  >90 mL/min   GFR calc Af Amer >90  >90 mL/min   Comment: (NOTE)     The eGFR has been calculated using the CKD EPI equation.     This calculation has not been validated in all clinical situations.     eGFR's persistently <90 mL/min signify possible Chronic Kidney     Disease.  LIPASE, BLOOD     Status: None   Collection Time    06/13/13  6:45 PM      Result Value Ref Range   Lipase 30  11 - 59 U/L   No results found.  Review of Systems  Constitutional: Positive for malaise/fatigue.  HENT: Negative.   Eyes: Negative.   Respiratory: Negative.   Cardiovascular: Negative.   Gastrointestinal: Positive for nausea and abdominal pain.  Genitourinary: Negative.   Musculoskeletal: Positive for back pain.  Skin: Negative.   All other systems reviewed  and are negative.   Blood pressure 101/64, pulse 55, temperature 97.3 F (36.3 C), temperature source Oral, resp. rate 20, height _0  (1.676 m), weight 107.502 kg (237 lb), last menstrual period 06/07/2013, SpO2 98.00%. Physical Exam  Constitutional: She is oriented to person, place, and time. She appears well-developed and well-nourished.  HENT:  Head: Normocephalic and atraumatic.  Eyes: No scleral icterus.  Neck: Normal range of motion. Neck supple.  Cardiovascular: Normal rate, regular rhythm and normal heart sounds.   Respiratory: Effort normal and breath sounds normal.  GI: Soft. She exhibits no distension. There is tenderness. There is no rebound and no guarding.  Tender in the right upper quadrant to palpation. No rigidity noted.  Neurological: She is alert and oriented to person, place, and time.  Skin: Skin is warm and dry.     Assessment/Plan Impression: Acute cholecystitis, cholelithiasis, elevated bilirubin level with elevated liver transaminases. Lipase is within normal limits. Ultrasound of the gallbladder reveals a dilated common bile duct at 10 mm. Plan: We'll admit patient to hospital for bowel rest and IV fluids. We'll start her on pain medications. She will need an MRCP tomorrow to evaluate the common bile duct. Will repeat liver enzyme tests. She will need a laparoscopic cholecystectomy prior to discharge. The management plan has been explained to the patient. All questions have been answered.  Pamela Mathis 06/13/2013, 8:38 PM

## 2013-06-13 NOTE — ED Notes (Addendum)
Patient c/o upper abd pain. Per patient seen here on Friday and diagnosed with cholecystitis. Patient is to follow-up with Dr Arnoldo Morale on 4/21. Patient reports increased pain despite taking percocet. Per patient took percocet 1 hour ago. Reports nausea. Denies any fevers, vomiting, or diarrhea.

## 2013-06-13 NOTE — ED Provider Notes (Signed)
CSN: [id number]     Arrival date & time 06/13/13  1737 History   First MD Initiated Contact with Patient 06/13/13 1751     Chief Complaint  Patient presents with  . Abdominal Pain     (Consider location/radiation/quality/duration/timing/severity/associated sxs/prior Treatment) HPI Comments: Patient presents to the ER for evaluation of pain in the upper and central abdomen. Patient reports that symptoms began approximately 3 hours ago. She has had 2 hours of constant, severe pain in the epigastric region. No nausea or vomiting. She did take Percocet, no improvement initially, but now pain is starting to ease off.  Patient is a 33 y.o. female presenting with abdominal pain.  Abdominal Pain Associated symptoms: nausea     Past Medical History  Diagnosis Date  . No pertinent past medical history   . Gastroesophageal reflux   . Cholecystitis    Past Surgical History  Procedure Laterality Date  . Wisdom tooth extraction    . Cesarean section  12/19/2011    Procedure: CESAREAN SECTION;  Surgeon: Cyril Mourning, MD;  Location: [facility];  Service: Obstetrics;  Laterality: N/A;   Family History  Problem Relation Age of Onset  . Other Neg Hx    History  Substance Use Topics  . Smoking status: Never Smoker   . Smokeless tobacco: Never Used  . Alcohol Use: No   OB History   Grav Para Term Preterm Abortions TAB SAB Ect Mult Living   1 1 1  0 0 0 0 0 0 1     Review of Systems  Gastrointestinal: Positive for nausea and abdominal pain.  All other systems reviewed and are negative.     Allergies  Review of patient's allergies indicates no known allergies.  Home Medications   Prior to Admission medications   Medication Sig Start Date End Date Taking? Authorizing Provider  calcium carbonate (TUMS - DOSED IN MG ELEMENTAL CALCIUM) 500 MG chewable tablet Chew 2 tablets by mouth as needed. hearburn    Historical Provider, MD  diphenhydrAMINE (BENADRYL) 25 MG tablet Take 50 mg by  mouth every 6 (six) hours as needed for itching.    Historical Provider, MD  HYDROcodone-acetaminophen (NORCO/VICODIN) 5-325 MG per tablet Take 1 tablet by mouth daily as needed for moderate pain.  04/19/13   Historical Provider, MD  ondansetron (ZOFRAN) 4 MG tablet Take 1 tablet (4 mg total) by mouth every 6 (six) hours. 06/11/13   Orpah Greek, MD  oxyCODONE-acetaminophen (PERCOCET) 5-325 MG per tablet Take 1 tablet by mouth every 4 (four) hours as needed. 06/11/13   Orpah Greek, MD  pantoprazole (PROTONIX) 40 MG tablet Take 40 mg by mouth daily.    Historical Provider, MD  triamcinolone cream (KENALOG) 0.1 % Apply 1 application topically 2 (two) times daily. 05/31/13   Historical Provider, MD   BP 116/77  Pulse 71  Temp(Src) 98.1 F (36.7 C) (Oral)  Resp 20  Ht 5\' 6"  (1.676 m)  Wt 237 lb (107.502 kg)  BMI 38.27 kg/m2  SpO2 100%  LMP 06/07/2013 Physical Exam  Constitutional: She is oriented to person, place, and time. She appears well-developed and well-nourished. No distress.  HENT:  Head: Normocephalic and atraumatic.  Right Ear: Hearing normal.  Left Ear: Hearing normal.  Nose: Nose normal.  Mouth/Throat: Oropharynx is clear and moist and mucous membranes are normal.  Eyes: Conjunctivae and EOM are normal. Pupils are equal, round, and reactive to light.  Neck: Normal range of motion. Neck supple.  Cardiovascular: Regular rhythm, S1 normal and S2 normal.  Exam reveals no gallop and no friction rub.   No murmur heard. Pulmonary/Chest: Effort normal and breath sounds normal. No respiratory distress. She exhibits no tenderness.  Abdominal: Soft. Normal appearance and bowel sounds are normal. There is no hepatosplenomegaly. There is tenderness in the right upper quadrant and epigastric area. There is no rebound, no guarding, no tenderness at McBurney's point and negative Murphy's sign. No hernia.  Musculoskeletal: Normal range of motion.  Neurological: She is alert and  oriented to person, place, and time. She has normal strength. No cranial nerve deficit or sensory deficit. Coordination normal. GCS eye subscore is 4. GCS verbal subscore is 5. GCS motor subscore is 6.  Skin: Skin is warm, dry and intact. No rash noted. No cyanosis.  Psychiatric: She has a normal mood and affect. Her speech is normal and behavior is normal. Thought content normal.    ED Course  Procedures (including critical care time) Labs Review Labs Reviewed  CBC WITH DIFFERENTIAL  COMPREHENSIVE METABOLIC PANEL  LIPASE, BLOOD    Imaging Review No results found.   EKG Interpretation None      MDM   Final diagnoses:  Cholecystitis   Patient returns for evaluation of abdominal pain. Patient was seen 2 days ago and had evidence of acute cholecystitis on her workup. As she had significant improvement, it was felt that the patient might be able to be maintained with analgesics as an outpatient over the weekend and followup for surgery this week. She has, however, had recurrent pain not controlled with her pain medications at home and therefore general surgery will be consulted for admission.   Orpah Greek, MD 06/13/13 [phone]

## 2013-06-14 ENCOUNTER — Inpatient Hospital Stay (HOSPITAL_COMMUNITY): Payer: 59

## 2013-06-14 LAB — HEPATIC FUNCTION PANEL
ALT: 754 U/L — AB (ref 0–35)
AST: 279 U/L — ABNORMAL HIGH (ref 0–37)
Albumin: 2.9 g/dL — ABNORMAL LOW (ref 3.5–5.2)
Alkaline Phosphatase: 293 U/L — ABNORMAL HIGH (ref 39–117)
BILIRUBIN DIRECT: 0.3 mg/dL (ref 0.0–0.3)
BILIRUBIN TOTAL: 1.1 mg/dL (ref 0.3–1.2)
Indirect Bilirubin: 0.8 mg/dL (ref 0.3–0.9)
Total Protein: 6.4 g/dL (ref 6.0–8.3)

## 2013-06-14 LAB — BASIC METABOLIC PANEL
BUN: 5 mg/dL — AB (ref 6–23)
CHLORIDE: 104 meq/L (ref 96–112)
CO2: 26 mEq/L (ref 19–32)
Calcium: 8.1 mg/dL — ABNORMAL LOW (ref 8.4–10.5)
Creatinine, Ser: 0.76 mg/dL (ref 0.50–1.10)
GFR calc Af Amer: >90 mL/min (ref >90)
GFR calc non Af Amer: >90 mL/min (ref >90)
Glucose, Bld: 105 mg/dL — ABNORMAL HIGH (ref 70–99)
Potassium: 4 mEq/L (ref 3.7–5.3)
Sodium: 139 mEq/L (ref 137–147)

## 2013-06-14 LAB — CBC
HEMATOCRIT: 37.6 % (ref 36.0–46.0)
Hemoglobin: 12.6 g/dL (ref 12.0–15.0)
MCH: 29.2 pg (ref 26.0–34.0)
MCHC: 33.5 g/dL (ref 30.0–36.0)
MCV: 87.2 fL (ref 78.0–100.0)
PLATELETS: 210 10*3/uL (ref 150–400)
RBC: 4.31 MIL/uL (ref 3.87–5.11)
RDW: 12.7 % (ref 11.5–15.5)
WBC: 4 10*3/uL (ref 4.0–10.5)

## 2013-06-14 LAB — PHOSPHORUS: PHOSPHORUS: 2.5 mg/dL (ref 2.3–4.6)

## 2013-06-14 LAB — MAGNESIUM: Magnesium: 2 mg/dL (ref 1.5–2.5)

## 2013-06-14 NOTE — Care Management Note (Signed)
    Page 1 of 1   06/14/2013     1:54:23 PM CARE MANAGEMENT NOTE 06/14/2013  Patient:  Pamela Mathis, Pamela Mathis   Account Number:  [id number]  Date Initiated:  06/14/2013  Documentation initiated by:  Claretha Cooper  Subjective/Objective Assessment:   Pt admitted from home where she lives with her spouse. Anticipate Gallbladder surgery after MRCP towmorrow.     Action/Plan:   Anticipated DC Date:     Anticipated DC Plan:  HOME/SELF CARE      DC Planning Services  CM consult      Choice offered to / List presented to:             Status of service:  Completed, signed off Medicare Important Message given?   (If response is "NO", the following Medicare IM given date fields will be blank) Date Medicare IM given:   Date Additional Medicare IM given:    Discharge Disposition:    Per UR Regulation:    If discussed at Long Length of Stay Meetings, dates discussed:    Comments:  06/14/13 Claretha Cooper RN BSN CM No HH/DME needs identified.

## 2013-06-14 NOTE — Progress Notes (Signed)
Subjective: Start having some right upper quadrant abdominal pain requiring IV fentanyl.  Objective: Vital signs in last 24 hours: Temp:  [97.3 F (36.3 C)-98.2 F (36.8 C)] 98.2 F (36.8 C) (04/20 0445) Pulse Rate:  [55-71] 60 (04/20 0445) Resp:  [20] 20 (04/20 0445) BP: (84-129)/(50-88) 98/50 mmHg (04/20 0500) SpO2:  [98 %-100 %] 99 % (04/20 0445) Weight:  [107.502 kg (237 lb)] 107.502 kg (237 lb) (04/19 1754) Last BM Date: 06/13/13  Intake/Output from previous day:   Intake/Output this shift:    General appearance: alert, cooperative and no distress Resp: clear to auscultation bilaterally Cardio: regular rate and rhythm, S1, S2 normal, no murmur, click, rub or gallop GI: Soft, slightly tender in right upper quadrant to palpation. No rigidity noted.  Lab Results:   Recent Labs  06/13/13 1845 06/14/13 0554  WBC 5.7 4.0  HGB 13.8 12.6  HCT 40.0 37.6  PLT 218 210   BMET  Recent Labs  06/13/13 1845 06/14/13 0554  NA 137 139  K 3.6* 4.0  CL 99 104  CO2 27 26  GLUCOSE 129* 105*  BUN 8 5*  CREATININE 0.76 0.76  CALCIUM 8.7 8.1*   PT/INR No results found for this basename: LABPROT, INR,  in the last 72 hours  Studies/Results: Mr Abdomen Mrcp Wo Cm  06/14/2013   CLINICAL DATA:  Abdominal pain and nausea. Elevated bilirubin. Biliary dilatation on recent ultrasound.  EXAM: MRI ABDOMEN WITHOUT  (INCLUDING MRCP)  TECHNIQUE: Multiplanar multisequence MR imaging of the abdomen was performed. Heavily T2-weighted images of the biliary and pancreatic ducts were obtained, and three-dimensional MRCP images were rendered by post processing.  COMPARISON:  Abdomen ultrasound on 06/11/2013  FINDINGS: Multiple tiny gallstones are seen however there are no definite findings of cholecystitis. There is mild biliary ductal dilatation with common bile duct measuring 11 mm in diameter. The common bile duct shows smooth tapering distally, and there is no evidence of choledocholithiasis  or stricture. No evidence of pancreatic ductal dilatation or pancreas divisum.  Hepatic parenchyma has normal signal intensity, and no liver masses are identified on this noncontrast study. A tiny sub-cm fluid attenuation cyst is noted in the peripheral right hepatic lobe. No evidence of pancreatic mass or peripancreatic inflammatory changes. Spleen, adrenal glands, and kidneys are also normal in appearance. No evidence of abdominal lymphadenopathy. No evidence of inflammatory process or abnormal fluid collections.  IMPRESSION: Cholelithiasis.  No radiographic evidence of acute cholecystitis.  Mild biliary ductal dilatation, with common bile duct measuring 11 mm. No evidence of choledocholithiasis or other etiology for biliary obstruction.   Electronically Signed   By: Earle Gell M.D.   On: 06/14/2013 11:30   Mr 3d Recon At Scanner  06/14/2013   CLINICAL DATA:  Abdominal pain and nausea. Elevated bilirubin. Biliary dilatation on recent ultrasound.  EXAM: MRI ABDOMEN WITHOUT  (INCLUDING MRCP)  TECHNIQUE: Multiplanar multisequence MR imaging of the abdomen was performed. Heavily T2-weighted images of the biliary and pancreatic ducts were obtained, and three-dimensional MRCP images were rendered by post processing.  COMPARISON:  Abdomen ultrasound on 06/11/2013  FINDINGS: Multiple tiny gallstones are seen however there are no definite findings of cholecystitis. There is mild biliary ductal dilatation with common bile duct measuring 11 mm in diameter. The common bile duct shows smooth tapering distally, and there is no evidence of choledocholithiasis or stricture. No evidence of pancreatic ductal dilatation or pancreas divisum.  Hepatic parenchyma has normal signal intensity, and no liver masses are identified on  this noncontrast study. A tiny sub-cm fluid attenuation cyst is noted in the peripheral right hepatic lobe. No evidence of pancreatic mass or peripancreatic inflammatory changes. Spleen, adrenal glands, and  kidneys are also normal in appearance. No evidence of abdominal lymphadenopathy. No evidence of inflammatory process or abnormal fluid collections.  IMPRESSION: Cholelithiasis.  No radiographic evidence of acute cholecystitis.  Mild biliary ductal dilatation, with common bile duct measuring 11 mm. No evidence of choledocholithiasis or other etiology for biliary obstruction.   Electronically Signed   By: Earle Gell M.D.   On: 06/14/2013 11:30    Anti-infectives: Anti-infectives   None      Assessment/Plan: Impression: Cholecystitis, cholelithiasis, dilated common bile duct. MRCP was negative for choledocholithiasis. Liver enzyme tests are slightly improved from yesterday. Plan:  Will continue IV pain control and control of her nausea. No need for antibiotic at this time as patient has no leukocytosis. Her bilirubin is starting to normalize. Will check liver enzyme tests again in a.m. It improved, we'll proceed with laparoscopic cholecystectomy with intraoperative cholangiograms.   LOS: 1 day    Pamela Mathis 06/14/2013

## 2013-06-14 NOTE — Progress Notes (Signed)
Utilization Review Complete  

## 2013-06-15 LAB — CBC
HCT: 39.2 % (ref 36.0–46.0)
HEMOGLOBIN: 12.8 g/dL (ref 12.0–15.0)
MCH: 29.2 pg (ref 26.0–34.0)
MCHC: 32.7 g/dL (ref 30.0–36.0)
MCV: 89.5 fL (ref 78.0–100.0)
Platelets: 212 10*3/uL (ref 150–400)
RBC: 4.38 MIL/uL (ref 3.87–5.11)
RDW: 13 % (ref 11.5–15.5)
WBC: 5 10*3/uL (ref 4.0–10.5)

## 2013-06-15 LAB — HEPATIC FUNCTION PANEL
ALK PHOS: 278 U/L — AB (ref 39–117)
ALT: 550 U/L — ABNORMAL HIGH (ref 0–35)
AST: 109 U/L — ABNORMAL HIGH (ref 0–37)
Albumin: 3 g/dL — ABNORMAL LOW (ref 3.5–5.2)
BILIRUBIN TOTAL: 0.6 mg/dL (ref 0.3–1.2)
Total Protein: 6.4 g/dL (ref 6.0–8.3)

## 2013-06-15 LAB — BASIC METABOLIC PANEL
BUN: 5 mg/dL — ABNORMAL LOW (ref 6–23)
CALCIUM: 8.6 mg/dL (ref 8.4–10.5)
CO2: 29 meq/L (ref 19–32)
CREATININE: 0.8 mg/dL (ref 0.50–1.10)
Chloride: 104 mEq/L (ref 96–112)
GFR calc Af Amer: >90 mL/min (ref >90)
GFR calc non Af Amer: >90 mL/min (ref >90)
GLUCOSE: 109 mg/dL — AB (ref 70–99)
Potassium: 4.6 mEq/L (ref 3.7–5.3)
SODIUM: 140 meq/L (ref 137–147)

## 2013-06-15 MED ORDER — CIPROFLOXACIN IN D5W 400 MG/200ML IV SOLN
400.0000 mg | INTRAVENOUS | Status: AC
  Administered 2013-06-16: 400 mg via INTRAVENOUS
  Filled 2013-06-15: qty 200

## 2013-06-15 MED ORDER — CHLORHEXIDINE GLUCONATE 4 % EX LIQD
1.0000 "application " | Freq: Once | CUTANEOUS | Status: AC
  Administered 2013-06-15: 1 via TOPICAL
  Filled 2013-06-15: qty 15

## 2013-06-15 NOTE — Progress Notes (Signed)
Subjective: Right upper quadrant pain improved. Mild nausea with diet.  Objective: Vital signs in last 24 hours: Temp:  [98 F (36.7 C)-98.3 F (36.8 C)] 98 F (36.7 C) (04/20 2252) Pulse Rate:  [51-56] 56 (04/20 2252) Resp:  [19-20] 19 (04/20 2252) BP: (86-89)/(61-62) 89/62 mmHg (04/20 2252) SpO2:  [99 %-100 %] 99 % (04/20 2252) Last BM Date: 06/14/13  Intake/Output from previous day: 04/20 0701 - 04/21 0700 In: 2121.7 [I.V.:2121.7] Out: 8546 [Urine:1150; Stool:1] Intake/Output this shift: Total I/O In: -  Out: 1000 [Urine:1000]  General appearance: alert, cooperative and no distress GI: Soft, tender in right upper quadrant to palpation. No rigidity noted.  Lab Results:   Recent Labs  06/14/13 0554 06/15/13 0554  WBC 4.0 5.0  HGB 12.6 12.8  HCT 37.6 39.2  PLT 210 212   BMET  Recent Labs  06/14/13 0554 06/15/13 0554  NA 139 140  K 4.0 4.6  CL 104 104  CO2 26 29  GLUCOSE 105* 109*  BUN 5* 5*  CREATININE 0.76 0.80  CALCIUM 8.1* 8.6   PT/INR No results found for this basename: LABPROT, INR,  in the last 72 hours  Studies/Results: Mr Abdomen Mrcp Wo Cm  06/14/2013   CLINICAL DATA:  Abdominal pain and nausea. Elevated bilirubin. Biliary dilatation on recent ultrasound.  EXAM: MRI ABDOMEN WITHOUT  (INCLUDING MRCP)  TECHNIQUE: Multiplanar multisequence MR imaging of the abdomen was performed. Heavily T2-weighted images of the biliary and pancreatic ducts were obtained, and three-dimensional MRCP images were rendered by post processing.  COMPARISON:  Abdomen ultrasound on 06/11/2013  FINDINGS: Multiple tiny gallstones are seen however there are no definite findings of cholecystitis. There is mild biliary ductal dilatation with common bile duct measuring 11 mm in diameter. The common bile duct shows smooth tapering distally, and there is no evidence of choledocholithiasis or stricture. No evidence of pancreatic ductal dilatation or pancreas divisum.  Hepatic  parenchyma has normal signal intensity, and no liver masses are identified on this noncontrast study. A tiny sub-cm fluid attenuation cyst is noted in the peripheral right hepatic lobe. No evidence of pancreatic mass or peripancreatic inflammatory changes. Spleen, adrenal glands, and kidneys are also normal in appearance. No evidence of abdominal lymphadenopathy. No evidence of inflammatory process or abnormal fluid collections.  IMPRESSION: Cholelithiasis.  No radiographic evidence of acute cholecystitis.  Mild biliary ductal dilatation, with common bile duct measuring 11 mm. No evidence of choledocholithiasis or other etiology for biliary obstruction.   Electronically Signed   By: Earle Gell M.D.   On: 06/14/2013 11:30   Mr 3d Recon At Scanner  06/14/2013   CLINICAL DATA:  Abdominal pain and nausea. Elevated bilirubin. Biliary dilatation on recent ultrasound.  EXAM: MRI ABDOMEN WITHOUT  (INCLUDING MRCP)  TECHNIQUE: Multiplanar multisequence MR imaging of the abdomen was performed. Heavily T2-weighted images of the biliary and pancreatic ducts were obtained, and three-dimensional MRCP images were rendered by post processing.  COMPARISON:  Abdomen ultrasound on 06/11/2013  FINDINGS: Multiple tiny gallstones are seen however there are no definite findings of cholecystitis. There is mild biliary ductal dilatation with common bile duct measuring 11 mm in diameter. The common bile duct shows smooth tapering distally, and there is no evidence of choledocholithiasis or stricture. No evidence of pancreatic ductal dilatation or pancreas divisum.  Hepatic parenchyma has normal signal intensity, and no liver masses are identified on this noncontrast study. A tiny sub-cm fluid attenuation cyst is noted in the peripheral right hepatic lobe. No  evidence of pancreatic mass or peripancreatic inflammatory changes. Spleen, adrenal glands, and kidneys are also normal in appearance. No evidence of abdominal lymphadenopathy. No  evidence of inflammatory process or abnormal fluid collections.  IMPRESSION: Cholelithiasis.  No radiographic evidence of acute cholecystitis.  Mild biliary ductal dilatation, with common bile duct measuring 11 mm. No evidence of choledocholithiasis or other etiology for biliary obstruction.   Electronically Signed   By: Earle Gell M.D.   On: 06/14/2013 11:30    Anti-infectives: Anti-infectives   None      Assessment/Plan: Impression: Cholecystitis, cholelithiasis, dilated common bile duct. Liver transaminases improving. Total bilirubin normal. Plan: Will proceed with laparoscopic cholecystectomy with cholangiograms tomorrow. The risks and benefits of the procedure including bleeding, infection, hepatobiliary injury, and the possibility of an open procedure were fully explained to the patient, who gave informed consent.  LOS: 2 days    Jamesetta So 06/15/2013

## 2013-06-15 NOTE — Progress Notes (Signed)
Pt given snack (diet coke, graham crackers,peanut butter) per request. Pt states she has called five times to the desk. I have not received this report. I have been in pt's room since coming on shift two times and NT reports she has been in pt's room many times. Pt given pain medication for generalized pain and anxiety medication per MAR. Pt repositioned to right side which she favors. She states that she is fine at this time. Pt appears comfortable with no sign of distress. Will continue to monitor

## 2013-06-16 ENCOUNTER — Inpatient Hospital Stay (HOSPITAL_COMMUNITY): Payer: 59 | Admitting: Anesthesiology

## 2013-06-16 ENCOUNTER — Encounter (HOSPITAL_COMMUNITY): Payer: Self-pay | Admitting: *Deleted

## 2013-06-16 ENCOUNTER — Encounter (HOSPITAL_COMMUNITY): Payer: 59 | Admitting: Anesthesiology

## 2013-06-16 ENCOUNTER — Encounter (HOSPITAL_COMMUNITY)
Admission: EM | Disposition: A | Discharge status: Home / Self Care | Payer: Self-pay | Source: Home / Self Care | Attending: General Surgery

## 2013-06-16 HISTORY — PX: CHOLECYSTECTOMY: SHX55

## 2013-06-16 LAB — SURGICAL PCR SCREEN
MRSA, PCR: NEGATIVE
STAPHYLOCOCCUS AUREUS: NEGATIVE

## 2013-06-16 SURGERY — LAPAROSCOPIC CHOLECYSTECTOMY
Anesthesia: General | Site: Abdomen

## 2013-06-16 MED ORDER — FENTANYL CITRATE 0.05 MG/ML IJ SOLN
INTRAMUSCULAR | Status: AC
  Filled 2013-06-16: qty 2

## 2013-06-16 MED ORDER — LACTATED RINGERS IV SOLN
INTRAVENOUS | Status: DC | PRN
  Administered 2013-06-16 (×2): via INTRAVENOUS

## 2013-06-16 MED ORDER — ROCURONIUM BROMIDE 50 MG/5ML IV SOLN
INTRAVENOUS | Status: AC
  Filled 2013-06-16: qty 1

## 2013-06-16 MED ORDER — MIDAZOLAM HCL 2 MG/2ML IJ SOLN
1.0000 mg | INTRAMUSCULAR | Status: DC | PRN
  Administered 2013-06-16: 2 mg via INTRAVENOUS

## 2013-06-16 MED ORDER — FENTANYL CITRATE 0.05 MG/ML IJ SOLN
25.0000 ug | INTRAMUSCULAR | Status: DC | PRN
  Administered 2013-06-16 (×4): 50 ug via INTRAVENOUS

## 2013-06-16 MED ORDER — ONDANSETRON HCL 4 MG/2ML IJ SOLN: 4.0000 mg | Freq: Once | INTRAMUSCULAR | Status: DC | PRN

## 2013-06-16 MED ORDER — GLYCOPYRROLATE 0.2 MG/ML IJ SOLN
0.2000 mg | Freq: Once | INTRAMUSCULAR | Status: AC
  Administered 2013-06-16: 0.2 mg via INTRAVENOUS

## 2013-06-16 MED ORDER — ONDANSETRON HCL 4 MG/2ML IJ SOLN
4.0000 mg | Freq: Once | INTRAMUSCULAR | Status: AC
  Administered 2013-06-16: 4 mg via INTRAVENOUS

## 2013-06-16 MED ORDER — ONDANSETRON HCL 4 MG/2ML IJ SOLN
4.0000 mg | Freq: Four times a day (QID) | INTRAMUSCULAR | Status: DC | PRN
  Administered 2013-06-16 – 2013-06-17 (×3): 4 mg via INTRAVENOUS
  Filled 2013-06-16 (×2): qty 2

## 2013-06-16 MED ORDER — CIPROFLOXACIN IN D5W 400 MG/200ML IV SOLN
INTRAVENOUS | Status: DC | PRN
  Administered 2013-06-16: 400 mg via INTRAVENOUS

## 2013-06-16 MED ORDER — ONDANSETRON HCL 4 MG/2ML IJ SOLN
INTRAMUSCULAR | Status: DC | PRN
  Administered 2013-06-16: 4 mg via INTRAVENOUS

## 2013-06-16 MED ORDER — ONDANSETRON HCL 4 MG/2ML IJ SOLN
INTRAMUSCULAR | Status: AC
  Filled 2013-06-16: qty 2

## 2013-06-16 MED ORDER — PROPOFOL 10 MG/ML IV EMUL
INTRAVENOUS | Status: AC
  Filled 2013-06-16: qty 20

## 2013-06-16 MED ORDER — FENTANYL CITRATE 0.05 MG/ML IJ SOLN
INTRAMUSCULAR | Status: DC | PRN
  Administered 2013-06-16: 50 ug via INTRAVENOUS
  Administered 2013-06-16: 100 ug via INTRAVENOUS
  Administered 2013-06-16 (×4): 50 ug via INTRAVENOUS

## 2013-06-16 MED ORDER — CIPROFLOXACIN IN D5W 400 MG/200ML IV SOLN
INTRAVENOUS | Status: AC
  Filled 2013-06-16: qty 200

## 2013-06-16 MED ORDER — BUPIVACAINE HCL 0.5 % IJ SOLN
INTRAMUSCULAR | Status: DC | PRN
  Administered 2013-06-16: 10 mL

## 2013-06-16 MED ORDER — ROCURONIUM BROMIDE 100 MG/10ML IV SOLN
INTRAVENOUS | Status: DC | PRN
  Administered 2013-06-16: 20 mg via INTRAVENOUS
  Administered 2013-06-16 (×2): 10 mg via INTRAVENOUS

## 2013-06-16 MED ORDER — OXYCODONE-ACETAMINOPHEN 5-325 MG PO TABS
1.0000 | ORAL_TABLET | ORAL | Status: DC | PRN
  Administered 2013-06-17 (×2): 2 via ORAL
  Filled 2013-06-16 (×2): qty 2

## 2013-06-16 MED ORDER — BUPIVACAINE HCL (PF) 0.5 % IJ SOLN
INTRAMUSCULAR | Status: AC
  Filled 2013-06-16: qty 30

## 2013-06-16 MED ORDER — LACTATED RINGERS IV SOLN
INTRAVENOUS | Status: DC
  Administered 2013-06-16 – 2013-06-17 (×2): via INTRAVENOUS

## 2013-06-16 MED ORDER — ONDANSETRON HCL 4 MG PO TABS
4.0000 mg | ORAL_TABLET | Freq: Four times a day (QID) | ORAL | Status: DC | PRN
  Filled 2013-06-16: qty 1

## 2013-06-16 MED ORDER — LIDOCAINE HCL (PF) 1 % IJ SOLN
INTRAMUSCULAR | Status: AC
  Filled 2013-06-16: qty 5

## 2013-06-16 MED ORDER — FENTANYL CITRATE 0.05 MG/ML IJ SOLN
INTRAMUSCULAR | Status: AC
  Filled 2013-06-16: qty 5

## 2013-06-16 MED ORDER — NEOSTIGMINE METHYLSULFATE 1 MG/ML IJ SOLN
INTRAMUSCULAR | Status: DC | PRN
  Administered 2013-06-16: 4 mg via INTRAVENOUS

## 2013-06-16 MED ORDER — LACTATED RINGERS IV SOLN
INTRAVENOUS | Status: DC
  Administered 2013-06-16: 14:00:00 via INTRAVENOUS

## 2013-06-16 MED ORDER — LIDOCAINE HCL (CARDIAC) 20 MG/ML IV SOLN
INTRAVENOUS | Status: DC | PRN
Start: 2013-06-16 — End: 2013-06-16
  Administered 2013-06-16: 50 mg via INTRAVENOUS

## 2013-06-16 MED ORDER — SUCCINYLCHOLINE CHLORIDE 20 MG/ML IJ SOLN
INTRAMUSCULAR | Status: DC | PRN
  Administered 2013-06-16: 120 mg via INTRAVENOUS

## 2013-06-16 MED ORDER — POVIDONE-IODINE 10 % OINT PACKET
TOPICAL_OINTMENT | CUTANEOUS | Status: DC | PRN
  Administered 2013-06-16: 2 via TOPICAL

## 2013-06-16 MED ORDER — PROPOFOL 10 MG/ML IV BOLUS
INTRAVENOUS | Status: DC | PRN
  Administered 2013-06-16: 150 mg via INTRAVENOUS

## 2013-06-16 MED ORDER — MIDAZOLAM HCL 2 MG/2ML IJ SOLN
INTRAMUSCULAR | Status: AC
  Filled 2013-06-16: qty 2

## 2013-06-16 MED ORDER — 0.9 % SODIUM CHLORIDE (POUR BTL) OPTIME
TOPICAL | Status: DC | PRN
  Administered 2013-06-16: 1000 mL

## 2013-06-16 MED ORDER — POVIDONE-IODINE 10 % EX OINT
TOPICAL_OINTMENT | CUTANEOUS | Status: AC
  Filled 2013-06-16: qty 2

## 2013-06-16 MED ORDER — GLYCOPYRROLATE 0.2 MG/ML IJ SOLN
INTRAMUSCULAR | Status: DC | PRN
  Administered 2013-06-16: 0.6 mg via INTRAVENOUS

## 2013-06-16 SURGICAL SUPPLY — 44 items
APPLIER CLIP LAPSCP 10X32 DD (CLIP) ×4 IMPLANT
BAG HAMPER (MISCELLANEOUS) ×4 IMPLANT
BAG SPEC RTRVL LRG 6X4 10 (ENDOMECHANICALS) ×2
CATH CHOLANGIOGRAM 4.5FR (CATHETERS) ×4 IMPLANT
CLOTH BEACON ORANGE TIMEOUT ST (SAFETY) ×4 IMPLANT
COVER LIGHT HANDLE STERIS (MISCELLANEOUS) ×8 IMPLANT
COVER MAYO STAND XLG (DRAPE) ×4 IMPLANT
DECANTER SPIKE VIAL GLASS SM (MISCELLANEOUS) ×4 IMPLANT
DISSECTOR BLUNT TIP ENDO 5MM (MISCELLANEOUS) IMPLANT
DRAPE C-ARM FOLDED MOBILE STRL (DRAPES) ×4 IMPLANT
DURAPREP 26ML APPLICATOR (WOUND CARE) ×4 IMPLANT
ELECT REM PT RETURN 9FT ADLT (ELECTROSURGICAL) ×4
ELECTRODE REM PT RTRN 9FT ADLT (ELECTROSURGICAL) ×2 IMPLANT
FILTER SMOKE EVAC LAPAROSHD (FILTER) ×4 IMPLANT
FORMALIN 10 PREFIL 120ML (MISCELLANEOUS) ×4 IMPLANT
GLOVE BIO SURGEON STRL SZ7.5 (GLOVE) ×4 IMPLANT
GLOVE BIOGEL PI IND STRL 8 (GLOVE) ×2 IMPLANT
GLOVE BIOGEL PI INDICATOR 8 (GLOVE) ×2
GOWN STRL REUS W/TWL LRG LVL3 (GOWN DISPOSABLE) ×12 IMPLANT
HEMOSTAT SNOW SURGICEL 2X4 (HEMOSTASIS) ×4 IMPLANT
INST SET LAPROSCOPIC AP (KITS) ×4 IMPLANT
KIT ROOM TURNOVER APOR (KITS) ×4 IMPLANT
MANIFOLD NEPTUNE II (INSTRUMENTS) ×4 IMPLANT
NEEDLE INSUFFLATION 120MM (ENDOMECHANICALS) ×4 IMPLANT
NS IRRIG 1000ML POUR BTL (IV SOLUTION) ×4 IMPLANT
PACK LAP CHOLE LZT030E (CUSTOM PROCEDURE TRAY) ×4 IMPLANT
PAD ARMBOARD 7.5X6 YLW CONV (MISCELLANEOUS) ×4 IMPLANT
POUCH SPECIMEN RETRIEVAL 10MM (ENDOMECHANICALS) ×4 IMPLANT
SET BASIN LINEN APH (SET/KITS/TRAYS/PACK) ×4 IMPLANT
SET TUBE IRRIG SUCTION NO TIP (IRRIGATION / IRRIGATOR) IMPLANT
SLEEVE ENDOPATH XCEL 5M (ENDOMECHANICALS) ×4 IMPLANT
SPONGE GAUZE 2X2 8PLY STER LF (GAUZE/BANDAGES/DRESSINGS) ×4
SPONGE GAUZE 2X2 8PLY STRL LF (GAUZE/BANDAGES/DRESSINGS) ×12 IMPLANT
STAPLER VISISTAT (STAPLE) ×4 IMPLANT
SUT VICRYL 0 UR6 27IN ABS (SUTURE) ×4 IMPLANT
SYR 20CC LL (SYRINGE) ×4 IMPLANT
SYR 30ML LL (SYRINGE) ×4 IMPLANT
TAPE CLOTH SURG 4X10 WHT LF (GAUZE/BANDAGES/DRESSINGS) ×3 IMPLANT
TROCAR ENDO BLADELESS 11MM (ENDOMECHANICALS) ×4 IMPLANT
TROCAR XCEL NON-BLD 5MMX100MML (ENDOMECHANICALS) ×4 IMPLANT
TROCAR XCEL UNIV SLVE 11M 100M (ENDOMECHANICALS) ×4 IMPLANT
TUBING INSUFFLATION (TUBING) ×4 IMPLANT
WARMER LAPAROSCOPE (MISCELLANEOUS) ×4 IMPLANT
YANKAUER SUCT 12FT TUBE ARGYLE (SUCTIONS) ×4 IMPLANT

## 2013-06-16 NOTE — OR Nursing (Signed)
Arrived to preop , complaints of pain at iv site. Unable to flush.  Iv attempted time 2 by Abbie Sons RN-BC  Time 2 ,  Iv attempted time 2 by kim canady RN

## 2013-06-16 NOTE — Op Note (Signed)
Patient:  Pamela Mathis  DOB:  Dec 14, 1980  MRN:  [id number]   Preop Diagnosis:  Cholecystitis, cholelithiasis  Postop Diagnosis:  Same  Procedure:  Laparoscopic cholecystectomy  Surgeon:  Aviva Signs, M.D.  Anes:  General endotracheal  Indications:  Patient is a 33 year old white female who presented to the emergency room with worsening right upper quadrant abdominal pain. She will go to have significant elevated liver enzyme tests and a slightly dilated common bile duct. She did go undergo an MRCP which revealed no choledocholithiasis. Her liver enzyme tests have improved significantly. She now presents for lap scopic cholecystectomy with possible cholangiograms. The risks and benefits of the procedure including bleeding, infection, cardiopulmonary difficulties, hepatobiliary injury, the possibility of an open procedure were fully explained to the patient, who gave informed consent.  Procedure note:  The patient is placed the supine position. After induction of general endotracheal anesthesia, the abdomen was prepped and draped using usual sterile technique with DuraPrep. Surgical site confirmation was performed.  A supraumbilical incision was made down to the fascia. A Veress needle was introduced into the abdominal cavity and confirmation of placement was done using the saline drop test. The abdomen was then insufflated to 16 mm mercury pressure. An 11 mm trocar was introduced into the abdominal cavity under direct visualization without difficulty. The patient was placed in reverse Trendelenburg position and additional 11 mm trocar was placed the epigastric region and 5 mm trochars were placed the right upper quadrant right flank regions. Liver was inspected and noted within normal limits. The gallbladder was retracted in a dynamic fashion in order to expose the triangle of Calot. The cystic duct was first identified. Its juncture to the infundibulum was fully identified. It was significantly  thickened, and was felt that I could not safely do a cholangiogram. Endoclips were placed proximally distally on the cystic duct, the cystic duct was divided. It noted up to the cystic duct was small but had significant edema rounded. The cystic artery was ligated and divided without difficulty. The gallbladder was freed away from the gallbladder fossa using Bovie electrocautery. The gallbladder was delivered through the epigastric trocar site using an Endo Catch bag. The gallbladder fossa was inspected and no abnormal bleeding or bile leakage was noted. Surgicel is placed the gallbladder fossa. All fluid and air were then evacuated from the abdominal cavity prior to removal of the trochars.  All wounds were irrigated with normal saline. All wounds were checked with 0.5% Sensorcaine. The supraumbilical fascia was reapproximated using an 0 Vicryl interrupted suture. All skin incisions were closed using staples. Betadine ointment and dry sterile dressings were applied.  All tape and needle counts were correct at the end of the procedure. Patient was extubated in the operating room and transferred to PACU in stable condition.  Complications:  None  EBL:  Minimal  Specimen:  Gallbladder

## 2013-06-16 NOTE — Anesthesia Procedure Notes (Signed)
Procedure Name: Intubation Date/Time: 06/16/2013 2:14 PM Performed by: Andree Elk, AMY A Pre-anesthesia Checklist: Patient identified, Patient being monitored, Timeout performed, Emergency Drugs available and Suction available Patient Re-evaluated:Patient Re-evaluated prior to inductionOxygen Delivery Method: Circle System Utilized Preoxygenation: Pre-oxygenation with 100% oxygen Intubation Type: IV induction Ventilation: Mask ventilation without difficulty Laryngoscope Size: 3 and Miller Grade View: Grade I Tube type: Oral Tube size: 7.0 mm Number of attempts: 1 Airway Equipment and Method: stylet Placement Confirmation: ETT inserted through vocal cords under direct vision,  positive ETCO2 and breath sounds checked- equal and bilateral Secured at: 21 cm Tube secured with: Tape Dental Injury: Teeth and Oropharynx as per pre-operative assessment

## 2013-06-16 NOTE — Anesthesia Postprocedure Evaluation (Signed)
  Anesthesia Post-op Note  Patient: Pamela Mathis  Procedure(s) Performed: Procedure(s): LAPAROSCOPIC CHOLECYSTECTOMY (N/A)  Patient Location: PACU  Anesthesia Type:General  Level of Consciousness: awake, alert , oriented and patient cooperative  Airway and Oxygen Therapy: Patient Spontanous Breathing and Patient connected to face mask oxygen  Post-op Pain: mild  Post-op Assessment: Post-op Vital signs reviewed, Patient's Cardiovascular Status Stable, Respiratory Function Stable, Patent Airway and NAUSEA AND VOMITING PRESENT  Post-op Vital Signs: Reviewed and stable  Last Vitals:  Filed Vitals:   06/16/13 1459  BP: 127/75  Pulse:   Temp: 37 C  Resp: 20    Complications: No apparent anesthesia complications

## 2013-06-16 NOTE — Transfer of Care (Signed)
Immediate Anesthesia Transfer of Care Note  Patient: Pamela Mathis  Procedure(s) Performed: Procedure(s): LAPAROSCOPIC CHOLECYSTECTOMY (N/A)  Patient Location: PACU  Anesthesia Type:General  Level of Consciousness: awake, alert , oriented and patient cooperative  Airway & Oxygen Therapy: Patient Spontanous Breathing and Patient connected to face mask oxygen  Post-op Assessment: Report given to PACU RN and Post -op Vital signs reviewed and stable  Post vital signs: Reviewed and stable  Complications: No apparent anesthesia complications

## 2013-06-16 NOTE — OR Nursing (Signed)
IV attempted by Nelda Severe RN time 1,  IV attemted time2 by Floyde Parkins CRNA,  Finally  sucesssgful  Attempt to right wtrist # 20.

## 2013-06-16 NOTE — Anesthesia Preprocedure Evaluation (Signed)
Anesthesia Evaluation  Patient identified by MRN, date of birth, ID band Patient awake    Reviewed: Allergy & Precautions, H&P , NPO status , Patient's Chart, lab work & pertinent test results  Airway Mallampati: II TM Distance: >3 FB Neck ROM: full    Dental  (+) Teeth Intact   Pulmonary  breath sounds clear to auscultation        Cardiovascular Rhythm:regular Rate:Normal     Neuro/Psych    GI/Hepatic GERD-  Medicated,  Endo/Other  Morbid obesity  Renal/GU      Musculoskeletal   Abdominal   Peds  Hematology   Anesthesia Other Findings       Reproductive/Obstetrics                           Anesthesia Physical Anesthesia Plan  ASA: II  Anesthesia Plan: General   Post-op Pain Management:    Induction: Intravenous, Rapid sequence and Cricoid pressure planned  Airway Management Planned: Oral ETT  Additional Equipment:   Intra-op Plan:   Post-operative Plan: Extubation in OR  Informed Consent: I have reviewed the patients History and Physical, chart, labs and discussed the procedure including the risks, benefits and alternatives for the proposed anesthesia with the patient or authorized representative who has indicated his/her understanding and acceptance.     Plan Discussed with:   Anesthesia Plan Comments:         Anesthesia Quick Evaluation

## 2013-06-17 ENCOUNTER — Encounter (HOSPITAL_COMMUNITY): Payer: Self-pay | Admitting: Anesthesiology

## 2013-06-17 LAB — CBC
HCT: 40.1 % (ref 36.0–46.0)
Hemoglobin: 13.5 g/dL (ref 12.0–15.0)
MCH: 29.5 pg (ref 26.0–34.0)
MCHC: 33.7 g/dL (ref 30.0–36.0)
MCV: 87.6 fL (ref 78.0–100.0)
PLATELETS: 208 10*3/uL (ref 150–400)
RBC: 4.58 MIL/uL (ref 3.87–5.11)
RDW: 12.9 % (ref 11.5–15.5)
WBC: 9 10*3/uL (ref 4.0–10.5)

## 2013-06-17 LAB — BASIC METABOLIC PANEL
BUN: 5 mg/dL — AB (ref 6–23)
CALCIUM: 9 mg/dL (ref 8.4–10.5)
CO2: 28 mEq/L (ref 19–32)
Chloride: 100 mEq/L (ref 96–112)
Creatinine, Ser: 0.69 mg/dL (ref 0.50–1.10)
GFR calc Af Amer: >90 mL/min (ref >90)
Glucose, Bld: 114 mg/dL — ABNORMAL HIGH (ref 70–99)
Potassium: 4 mEq/L (ref 3.7–5.3)
Sodium: 138 mEq/L (ref 137–147)

## 2013-06-17 LAB — HEPATIC FUNCTION PANEL
ALK PHOS: 297 U/L — AB (ref 39–117)
ALT: 361 U/L — ABNORMAL HIGH (ref 0–35)
AST: 87 U/L — ABNORMAL HIGH (ref 0–37)
Albumin: 3.1 g/dL — ABNORMAL LOW (ref 3.5–5.2)
Bilirubin, Direct: <0.2 mg/dL (ref 0.0–0.3)
TOTAL PROTEIN: 6.7 g/dL (ref 6.0–8.3)
Total Bilirubin: 0.9 mg/dL (ref 0.3–1.2)

## 2013-06-17 MED ORDER — OXYCODONE-ACETAMINOPHEN 5-325 MG PO TABS: 1.0000 | ORAL_TABLET | ORAL | Status: DC | PRN

## 2013-06-17 NOTE — Addendum Note (Signed)
Addendum created 06/17/13 1150 by Charmaine Downs, CRNA   Modules edited: Notes Section   Notes Section:  File: [id number]

## 2013-06-17 NOTE — Anesthesia Postprocedure Evaluation (Signed)
  Anesthesia Post-op Note  Patient: Pamela Mathis  Procedure(s) Performed: Procedure(s): LAPAROSCOPIC CHOLECYSTECTOMY (N/A)  Patient Location: room 319  Anesthesia Type:General  Level of Consciousness: awake, alert , oriented and patient cooperative  Airway and Oxygen Therapy: Patient Spontanous Breathing  Post-op Pain: 1 /10, mild  Post-op Assessment: Post-op Vital signs reviewed, Patient's Cardiovascular Status Stable, Respiratory Function Stable, Patent Airway and Pain level controlled  Post-op Vital Signs: Reviewed and stable  Last Vitals:  Filed Vitals:   06/17/13 0359  BP: 95/66  Pulse: 69  Temp: 37.4 C  Resp: 20    Complications: No apparent anesthesia complications

## 2013-06-17 NOTE — Discharge Summary (Signed)
Physician Discharge Summary  Patient ID: Pamela Mathis MRN: [id number] DOB/AGE: 1981/02/26 33 y.o.  Admit date: 06/13/2013 Discharge date: 06/17/2013  Admission Diagnoses: Cholecystitis, cholelithiasis  Discharge Diagnoses: Same Active Problems:   Cholecystitis, acute   Cholecystitis with cholelithiasis   Discharged Condition: good  Hospital Course: Patient is a 33 year old white female with a history of biliary colic secondary to cholelithiasis who presented emergency room on 06/13/2013 with acute cholecystitis secondary to cholelithiasis. She had elevation of liver enzyme tests. Her common bile duct was slightly dilated at 10 mm. She was admitted to the hospital for further evaluation treatment. MRCP was performed which revealed no choledocholithiasis. Her liver enzyme tests continued to return and normalize. She subsequently underwent laparoscopic cholecystectomy on 06/16/2013. She tolerated the procedure well. Her postoperative course was remarkable for moderate incisional pain and postoperative nausea. She is being discharged home in good improving condition.  Treatments: surgery: Laparoscopic cholecystectomy on 06/16/2013  Discharge Exam: Blood pressure 95/66, pulse 69, temperature 99.3 F (37.4 C), temperature source Oral, resp. rate 20, height 5\' 7"  (1.702 m), weight 104.1 kg (229 lb 8 oz), last menstrual period 06/07/2013, SpO2 98.00%. General appearance: alert, cooperative and no distress Resp: clear to auscultation bilaterally Cardio: regular rate and rhythm, S1, S2 normal, no murmur, click, rub or gallop GI: Soft. Dressings dry and intact.  Disposition: 01-Home or Self Care     Medication List         calcium carbonate 500 MG chewable tablet  Commonly known as:  TUMS - dosed in mg elemental calcium  Chew 2 tablets by mouth as needed. hearburn     diphenhydrAMINE 25 MG tablet  Commonly known as:  BENADRYL  Take 50 mg by mouth every 6 (six) hours as needed for  itching.     HYDROcodone-acetaminophen 5-325 MG per tablet  Commonly known as:  NORCO/VICODIN  Take 1 tablet by mouth daily as needed for moderate pain.     ondansetron 4 MG tablet  Commonly known as:  ZOFRAN  Take 1 tablet (4 mg total) by mouth every 6 (six) hours.     oxyCODONE-acetaminophen 5-325 MG per tablet  Commonly known as:  PERCOCET  Take 1-2 tablets by mouth every 4 (four) hours as needed.     pantoprazole 40 MG tablet  Commonly known as:  PROTONIX  Take 40 mg by mouth daily.     triamcinolone cream 0.1 %  Commonly known as:  KENALOG  Apply 1 application topically 2 (two) times daily.           Follow-up Information   Follow up with Jamesetta So, MD. Schedule an appointment as soon as possible for a visit on 06/24/2013.   Specialty:  General Surgery   Contact information:   [address] [place] [address] [phone]       Signed: Jamesetta So 06/17/2013, 9:14 AM

## 2013-06-17 NOTE — Discharge Instructions (Signed)
Laparoscopic Cholecystectomy, Care After °Refer to this sheet in the next few weeks. These instructions provide you with information on caring for yourself after your procedure. Your health care provider may also give you more specific instructions. Your treatment has been planned according to current medical practices, but problems sometimes occur. Call your health care provider if you have any problems or questions after your procedure. °WHAT TO EXPECT AFTER THE PROCEDURE °After your procedure, it is typical to have the following: °· Pain at your incision sites. You will be given pain medicines to control the pain. °· Mild nausea or vomiting. This should improve after the first 24 hours. °· Bloating and possibly shoulder pain from the gas used during the procedure. This will improve after the first 24 hours. °HOME CARE INSTRUCTIONS  °· Change bandages (dressings) as directed by your health care provider. °· Keep the wound dry and clean. You may wash the wound gently with soap and water. Gently blot or dab the area dry. °· Do not take baths or use swimming pools or hot tubs for 2 weeks or until your health care provider approves. °· Only take over-the-counter or prescription medicines as directed by your health care provider. °· Continue your normal diet as directed by your health care provider. °· Do not lift anything heavier than 10 pounds (4.5 kg) until your health care provider approves. °· Do not play contact sports for 1 week or until your health care provider approves. °SEEK MEDICAL CARE IF:  °· You have redness, swelling, or increasing pain in the wound. °· You notice yellowish-white fluid (pus) coming from the wound. °· You have drainage from the wound that lasts longer than 1 day. °· You notice a bad smell coming from the wound or dressing. °· Your surgical cuts (incisions) break open. °SEEK IMMEDIATE MEDICAL CARE IF:  °· You develop a rash. °· You have difficulty breathing. °· You have chest pain. °· You  have a fever. °· You have increasing pain in the shoulders (shoulder strap areas). °· You have dizzy episodes or faint while standing. °· You have severe abdominal pain. °· You feel sick to your stomach (nauseous) or throw up (vomit) and this lasts for more than 1 day. °Document Released: 02/11/2005 Document Revised: 12/02/2012 Document Reviewed: 09/23/2012 °[employer]® Patient Information ©2014 [employer]. ° °

## 2013-06-17 NOTE — Discharge Planning (Signed)
Pt stated she was ready to go home and pain was doing fine.  Pt given pain meds just before leaving and husband will be driving her home.  IV was removed.  Pt given scripts and toll of FU appointments needed.  Pt also educated on s/sx of infection at incision site and when to call doctor or return to the hospital.  Pt will be wheeled to car by PCT when ready.

## 2013-06-18 ENCOUNTER — Encounter (HOSPITAL_COMMUNITY): Payer: Self-pay | Admitting: General Surgery

## 2013-08-12 ENCOUNTER — Other Ambulatory Visit: Payer: Self-pay | Admitting: Neurosurgery

## 2013-08-12 DIAGNOSIS — D353 Benign neoplasm of craniopharyngeal duct: Principal | ICD-10-CM

## 2013-08-12 DIAGNOSIS — D352 Benign neoplasm of pituitary gland: Secondary | ICD-10-CM

## 2013-08-21 ENCOUNTER — Ambulatory Visit
Admission: RE | Admit: 2013-08-21 | Discharge: 2013-08-21 | Disposition: A | Discharge status: Home / Self Care | Payer: 59 | Source: Ambulatory Visit | Attending: Neurosurgery | Admitting: Neurosurgery

## 2013-08-21 DIAGNOSIS — D353 Benign neoplasm of craniopharyngeal duct: Principal | ICD-10-CM

## 2013-08-21 DIAGNOSIS — D352 Benign neoplasm of pituitary gland: Secondary | ICD-10-CM

## 2013-08-21 MED ORDER — GADOBENATE DIMEGLUMINE 529 MG/ML IV SOLN
10.0000 mL | Freq: Once | INTRAVENOUS | Status: AC | PRN
  Administered 2013-08-21: 10 mL via INTRAVENOUS

## 2013-12-27 ENCOUNTER — Encounter (HOSPITAL_COMMUNITY): Payer: Self-pay | Admitting: General Surgery

## 2014-08-15 ENCOUNTER — Ambulatory Visit (HOSPITAL_COMMUNITY): Payer: 59 | Attending: Foot & Ankle Surgery | Admitting: Physical Therapy

## 2014-08-15 DIAGNOSIS — G8929 Other chronic pain: Secondary | ICD-10-CM

## 2014-08-15 DIAGNOSIS — M25671 Stiffness of right ankle, not elsewhere classified: Secondary | ICD-10-CM | POA: Diagnosis not present

## 2014-08-15 DIAGNOSIS — M79671 Pain in right foot: Secondary | ICD-10-CM | POA: Insufficient documentation

## 2014-08-15 DIAGNOSIS — R262 Difficulty in walking, not elsewhere classified: Secondary | ICD-10-CM

## 2014-08-15 DIAGNOSIS — R29898 Other symptoms and signs involving the musculoskeletal system: Secondary | ICD-10-CM

## 2014-08-15 DIAGNOSIS — R531 Weakness: Secondary | ICD-10-CM | POA: Insufficient documentation

## 2014-08-15 NOTE — Patient Instructions (Signed)
Gastroc Stretch   Stand with right foot back, leg straight, forward leg bent. Keeping heel on floor, turned slightly out, lean into wall until stretch is felt in calf. Hold __30__ seconds. Repeat __3__ times per set. Do __1__ sets per session. Do 3____ sessions per day.  http://orth.exer.us/26   Copyright  VHI. All rights reserved.   

## 2014-08-15 NOTE — Therapy (Signed)
[place], [place], [address] Phone: [phone]   Fax:  [phone]  Physical Therapy Evaluation  Patient Details  Name: Pamela Mathis MRN: [id number] Date of Birth: February 21, 1981 Referring Provider:  Lonna Cobb, DPM  Encounter Date: 08/15/2014      PT End of Session - 08/15/14 1200    Visit Number 1   Number of Visits 12   PT Start Time 0807   PT Stop Time 0845   PT Time Calculation (min) 38 min   Activity Tolerance Patient tolerated treatment well   Behavior During Therapy [facility] for tasks assessed/performed      Past Medical History  Diagnosis Date  . No pertinent past medical history   . Gastroesophageal reflux   . Cholecystitis   . [id number](975.8)     Past Surgical History  Procedure Laterality Date  . Wisdom tooth extraction    . Cesarean section  12/19/2011    Procedure: CESAREAN SECTION;  Surgeon: Cyril Mourning, MD;  Location: [facility];  Service: Obstetrics;  Laterality: N/A;  . Cholecystectomy N/A 06/16/2013    Procedure: LAPAROSCOPIC CHOLECYSTECTOMY;  Surgeon: Jamesetta So, MD;  Location: [facility];  Service: General;  Laterality: N/A;    There were no vitals filed for this visit.  Visit Diagnosis:  Heel pain, chronic, right  Stiffness of ankle joint, right  Difficulty walking  Weakness of foot, right      Subjective Assessment - 08/15/14 1153    Currently in Pain? Yes   Pain Score 3    Pain Location Heel   Pain Orientation Right   Pain Descriptors / Indicators Aching   Pain Type Chronic pain   Pain Radiating Towards Rt heel             [facility] PT Assessment - 08/15/14 0817    Assessment   Medical Diagnosis Rt achilles tendon   Onset Date/Surgical Date 02/08/14   Hand Dominance Right   Next MD Visit 09/15/2014   Prior Therapy none   Precautions   Precautions None   Required Braces or Orthoses Other Brace/Splint   Other Brace/Splint camboot   Restrictions   Weight  Bearing Restrictions No   Balance Screen   Has the patient fallen in the past 6 months No   Has the patient had a decrease in activity level because of a fear of falling?  No   Is the patient reluctant to leave their home because of a fear of falling?  No   Home Environment   Living Environment Private residence   Living Arrangements Spouse/significant other   Type of [place] to enter   Entrance Stairs-Number of Steps 2   Entrance Stairs-Rails None   Home Layout One level   Prior Function   Level of Independence Independent   Vocation Full time employment   Pension scheme manager- standing; pushing , pulling , lifting    Leisure paint; flower arrangements.    Cognition   Overall Cognitive Status Within Functional Limits for tasks assessed   Observation/Other Assessments   Focus on Therapeutic Outcomes (FOTO)  41   Functional Tests   Functional tests Single leg stance   Single Leg Stance   Comments Lt    AROM   Right Ankle Dorsiflexion 5   Right Ankle Plantar Flexion 38   Right Ankle Inversion 20   Right Ankle Eversion 15   Strength   Right Ankle  Dorsiflexion 4/5   Right Ankle Plantar Flexion 4/5   Right Ankle Inversion 4/5   Right Ankle Eversion 4/5                   [facility] Adult PT Treatment/Exercise - 08/15/14 0001    Exercises   Exercises Ankle   Manual Therapy   Manual Therapy Soft tissue mobilization   Manual therapy comments Rt gastroc    Ankle Exercises: Standing   SLS x 60 seconds    Ankle Exercises: Supine   Isometrics --  all x 5                PT Education - 08/15/14 1155    Education provided Yes   Education Details HEP for strengthening and stretching; Weaning out of boot   Person(s) Educated Patient   Methods Explanation;Handout   Comprehension Verbalized understanding          PT Short Term Goals - 08/15/14 1201    PT SHORT TERM GOAL #1   Title I HEP   Time 1   Period Weeks   PT SHORT  TERM GOAL #2   Title Pt to be able to tolerate standing for 30 minutes with boot on without incresed pain   Time 2   Period Weeks   PT SHORT TERM GOAL #3   Title Pt to be able to tolerate walking with boot on for 45 minutes    Time 2   Period Weeks   PT SHORT TERM GOAL #4   Title Pt to be weaned from boot   Time 4   Period Weeks           PT Long Term Goals - 08/15/14 1202    PT LONG TERM GOAL #1   Title I in HEP   Time 6   Period Weeks   PT LONG TERM GOAL #2   Title Pt to be able to tolerate standing for 45 minutes withiout her boot on    Time 6   Period Weeks   PT LONG TERM GOAL #3   Title Pt to be able to walk for an hour without her boot on   Time 6   Period Weeks   PT LONG TERM GOAL #4   Title Pt pain to be no greater than a 2/10 with functional activities   Time 6   Period Weeks   PT LONG TERM GOAL #5   Title Pt to begin a walking program/ exercising for better health.    Time 6   Period Weeks               Plan - 08/15/14 1156    Clinical Impression Statement Ms. Wirsing is a 34 yo female with chronic plantar fascitis who has been referred to physical therapy.  Examination demonstrates decreased activtiy tolerance, decreased ROM, decreased strength and decreased pain.  Ms. Buendia will benefit from skilled PT to retun her to her prior functional level.    Pt will benefit from skilled therapeutic intervention in order to improve on the following deficits Decreased activity tolerance;Decreased range of motion;Decreased strength;Pain   Rehab Potential Good   PT Frequency 2x / week   PT Duration 6 weeks   PT Treatment/Interventions Patient/family education;Gait training;Stair training;Functional mobility training;Therapeutic activities;Therapeutic exercise;Manual techniques   PT Next Visit Plan give and instruct pt on t-band exercises begin sittng Baps progressing to standing activity as tolerated.  Continue with manual if pt is not having improved  pain may  begin iontphoresis ( already have order) .    PT Home Exercise Plan given   Consulted and Agree with Plan of Care Patient         Problem List Patient Active Problem List   Diagnosis Date Noted  . Cholecystitis, acute 06/13/2013  . Cholecystitis with cholelithiasis 06/13/2013  Rayetta Humphrey, PT CLT [phone] 08/15/2014, 12:07 PM  [place] [address] [place], [place], [address] Phone: [phone]   Fax:  [phone]

## 2014-08-17 ENCOUNTER — Ambulatory Visit (HOSPITAL_COMMUNITY): Payer: 59

## 2014-08-17 DIAGNOSIS — M79671 Pain in right foot: Secondary | ICD-10-CM | POA: Diagnosis not present

## 2014-08-17 DIAGNOSIS — R29898 Other symptoms and signs involving the musculoskeletal system: Secondary | ICD-10-CM

## 2014-08-17 DIAGNOSIS — G8929 Other chronic pain: Secondary | ICD-10-CM

## 2014-08-17 DIAGNOSIS — R262 Difficulty in walking, not elsewhere classified: Secondary | ICD-10-CM

## 2014-08-17 DIAGNOSIS — M25671 Stiffness of right ankle, not elsewhere classified: Secondary | ICD-10-CM

## 2014-08-17 NOTE — Patient Instructions (Addendum)
ANKLE: Dorsiflexion (Band)   Sit at edge of surface. Place band around top of foot. Keeping heel on floor, raise toes of banded foot. Hold 5 seconds. Use green theraband band. 10-20  reps per set, 1-2 sets per day, 3-5 days per week  Copyright  VHI. All rights reserved.   Plantarflexion (Eccentric), (Resistance Band)   Point foot down against resistance band. Slowly release for 3-5 seconds. Use green resistance band. 10-20 reps per set,1-2 sets per day, 3-5 days per week.  Copyright  VHI. All rights reserved.    Inversion (Eccentric), (Resistance Band)   Pull foot in against resistance band. Slowly release for 3-5 seconds. Use green resistance band. 10-20 reps per set, 1-2 sets per day, 3-5 days per week.  Copyright  VHI. All rights reserved.   Eversion (Eccentric), (Resistance Band)   Push foot outward against resistance band. Slowly release for 3-5 seconds. Use green resistance band. 10-20 reps per set, 1-2 sets per day,3-5 days per week.  Copyright  VHI. All rights reserved.

## 2014-08-17 NOTE — Therapy (Signed)
[place], [place], [address] Phone: [phone]   Fax:  [phone]  Physical Therapy Treatment  Patient Details  Name: Pamela Mathis MRN: [id number] Date of Birth: Apr 01, 1980 Referring Provider:  Glenda Chroman., MD  Encounter Date: 08/17/2014      PT End of Session - 08/17/14 0914    Visit Number 2   Number of Visits 12   Date for PT Re-Evaluation 09/14/14   PT Start Time 0852   PT Stop Time 0935   PT Time Calculation (min) 43 min   Activity Tolerance Patient tolerated treatment well   Behavior During Therapy [facility] for tasks assessed/performed      Past Medical History  Diagnosis Date  . No pertinent past medical history   . Gastroesophageal reflux   . Cholecystitis   . ENIDPOEU(235.3)     Past Surgical History  Procedure Laterality Date  . Wisdom tooth extraction    . Cesarean section  12/19/2011    Procedure: CESAREAN SECTION;  Surgeon: Cyril Mourning, MD;  Location: [facility];  Service: Obstetrics;  Laterality: N/A;  . Cholecystectomy N/A 06/16/2013    Procedure: LAPAROSCOPIC CHOLECYSTECTOMY;  Surgeon: Jamesetta So, MD;  Location: [facility];  Service: General;  Laterality: N/A;    There were no vitals filed for this visit.  Visit Diagnosis:  Heel pain, chronic, right  Stiffness of ankle joint, right  Difficulty walking  Weakness of foot, right      Subjective Assessment - 08/17/14 0856    Subjective Pt reports compliance with stretches 3x a day, current pain scale 2/10   Currently in Pain? Yes   Pain Score 2    Pain Location Ankle   Pain Orientation Right   Pain Descriptors / Indicators Aching;Dull            [facility] PT Assessment - 08/17/14 0001    Assessment   Medical Diagnosis Rt achilles tendon   Onset Date/Surgical Date 02/08/14   Hand Dominance Right   Next MD Visit Dail 09/15/2014   Prior Therapy none            [facility] Adult PT Treatment/Exercise - 08/17/14 0001    Exercises   Exercises Ankle   Manual Therapy   Manual Therapy Soft tissue mobilization   Manual therapy comments Rt gastroc/soleus complex, plantar surface of foot    Ankle Exercises: Stretches   Plantar Fascia Stretch 3 reps;30 seconds   Gastroc Stretch 3 reps;30 seconds   Gastroc Stretch Limitations standard against wall   Ankle Exercises: Seated   BAPS Level 3;10 reps   BAPS Weights (lbs) R/L, A/P, CW, CCW   Ankle Exercises: Supine   T-Band Green all directions, HEP given             PT Short Term Goals - 08/17/14 1009    PT SHORT TERM GOAL #1   Title I HEP   Status On-going   PT SHORT TERM GOAL #2   Title Pt to be able to tolerate standing for 30 minutes with boot on without incresed pain   PT SHORT TERM GOAL #3   Title Pt to be able to tolerate walking with boot on for 45 minutes    PT SHORT TERM GOAL #4   Title Pt to be weaned from boot   Status On-going           PT Long Term Goals - 08/17/14 1009    PT LONG  TERM GOAL #1   Title I in HEP   PT LONG TERM GOAL #2   Title Pt to be able to tolerate standing for 45 minutes withiout her boot on    PT LONG TERM GOAL #3   Title Pt to be able to walk for an hour without her boot on   PT LONG TERM GOAL #4   Title Pt pain to be no greater than a 2/10 with functional activities   PT LONG TERM GOAL #5   Title Pt to begin a walking program/ exercising for better health.                Plan - 08/17/14 0914    Clinical Impression Statement Reviewed goals, compliance with HEP and copy of evaluation given to pt.  Pt able to demonstrate appropriate form with HEP stretches with no cueing required.  Pt wearing tennis shoes into dept, stated she wears boots majority of time but plans to wear tennis shoes with orthotics to therapy and begin slowly at home.  Began seated BAPS and gave pt green theraband and printout all directions for ankle ROM and strengthening.  Ended session with manual techniques to reduce fascial  restrictions and reduce pain.     PT Next Visit Plan Reviewed compliance with tband HEP, answer questions as needed.  Continue stretches and seated BAPS board, progress to standing as tolerated.  Continue with if pt is not have improvements begin iontophoresis (already have order).        Problem List Patient Active Problem List   Diagnosis Date Noted  . Cholecystitis, acute 06/13/2013  . Cholecystitis with cholelithiasis 06/13/2013   Aldona Lento, PTA  Aldona Lento 08/17/2014, 10:12 AM  [place], [place], [address] Phone: [phone]   Fax:  [phone]

## 2014-08-22 ENCOUNTER — Ambulatory Visit (HOSPITAL_COMMUNITY): Payer: 59 | Admitting: Physical Therapy

## 2014-08-22 DIAGNOSIS — M79671 Pain in right foot: Secondary | ICD-10-CM | POA: Diagnosis not present

## 2014-08-22 DIAGNOSIS — M25671 Stiffness of right ankle, not elsewhere classified: Secondary | ICD-10-CM

## 2014-08-22 DIAGNOSIS — R29898 Other symptoms and signs involving the musculoskeletal system: Secondary | ICD-10-CM

## 2014-08-22 DIAGNOSIS — G8929 Other chronic pain: Secondary | ICD-10-CM

## 2014-08-22 DIAGNOSIS — R262 Difficulty in walking, not elsewhere classified: Secondary | ICD-10-CM

## 2014-08-22 NOTE — Therapy (Signed)
[place], [place], [address] Phone: [phone]   Fax:  [phone]  Physical Therapy Treatment  Patient Details  Name: Pamela Mathis MRN: [id number] Date of Birth: 1980-11-10 Referring Provider:  Lonna Cobb, DPM  Encounter Date: 08/22/2014      PT End of Session - 08/22/14 1158    Visit Number 3   Number of Visits 12   Date for PT Re-Evaluation 09/14/14   PT Start Time 1020   PT Stop Time 1100   PT Time Calculation (min) 40 min   Activity Tolerance Patient tolerated treatment well   Behavior During Therapy [facility] for tasks assessed/performed      Past Medical History  Diagnosis Date  . No pertinent past medical history   . Gastroesophageal reflux   . Cholecystitis   . [id number](935.7)     Past Surgical History  Procedure Laterality Date  . Wisdom tooth extraction    . Cesarean section  12/19/2011    Procedure: CESAREAN SECTION;  Surgeon: Cyril Mourning, MD;  Location: [facility];  Service: Obstetrics;  Laterality: N/A;  . Cholecystectomy N/A 06/16/2013    Procedure: LAPAROSCOPIC CHOLECYSTECTOMY;  Surgeon: Jamesetta So, MD;  Location: [facility];  Service: General;  Laterality: N/A;    There were no vitals filed for this visit.  Visit Diagnosis:  Heel pain, chronic, right  Stiffness of ankle joint, right  Difficulty walking  Weakness of foot, right      Subjective Assessment - 08/22/14 1029    Subjective Patient reports she had an OK weekend, did have a flareup of pain after last PT session but weekend was OK. No pain right now.    Pertinent History Pt has a pituitary tumor and is on medication for this.   Pt had her gall bladder out last year.    Currently in Pain? No/denies                         [facility] Adult PT Treatment/Exercise - 08/22/14 0001    Manual Therapy   Manual Therapy Soft tissue mobilization;Joint mobilization   Manual therapy comments Rt gastroc/soleus  complex, plantar surface of foot    Joint Mobilization Grade 2-3 mobilizations of midfoot, calcaneous, talus as tolerated    Ankle Exercises: Stretches   Gastroc Stretch 3 reps;30 seconds   Gastroc Stretch Limitations slantboard    Other Stretch Hamstring and Piriformis stretches; 3x30 seconds each side    Ankle Exercises: Seated   BAPS --   BAPS Weights (lbs) --   Other Seated Ankle Exercises Ankle PF/DF, inversion/eversion, ankle circles, ankle alphabet 1x10   Ankle Exercises: Standing   BAPS Standing;Level 3;Other (comment)  clockwise/counter clockwise    Other Standing Ankle Exercises Rockerboard AP 1x10                PT Education - 08/22/14 1157    Education provided Yes   Education Details educated to wear boot as needed but important to wean out of it as able    Person(s) Educated Patient   Methods Explanation   Comprehension Verbalized understanding          PT Short Term Goals - 08/17/14 1009    PT SHORT TERM GOAL #1   Title I HEP   Status On-going   PT SHORT TERM GOAL #2   Title Pt to be able to tolerate standing for 30 minutes with boot on  without incresed pain   PT SHORT TERM GOAL #3   Title Pt to be able to tolerate walking with boot on for 45 minutes    PT SHORT TERM GOAL #4   Title Pt to be weaned from boot   Status On-going           PT Long Term Goals - 08/17/14 1009    PT LONG TERM GOAL #1   Title I in [place] #2   Title Pt to be able to tolerate standing for 45 minutes withiout her boot on    PT LONG TERM GOAL #3   Title Pt to be able to walk for an hour without her boot on   PT LONG TERM GOAL #4   Title Pt pain to be no greater than a 2/10 with functional activities   PT LONG TERM GOAL #5   Title Pt to begin a walking program/ exercising for better health.                Plan - 08/22/14 1158    Clinical Impression Statement Continued functional exercises and stretches today with good tolerance by patient.  Good form with exercises and only very minor increase in pain during standing activities, which did quickly resolve in non-weight bearing position. Continued manual therapy with gentle and cautious introduction of joint mobilization and continuatiion of manual techniques to gastroc/soleus.  No increase in pain at end of session.    Pt will benefit from skilled therapeutic intervention in order to improve on the following deficits Decreased activity tolerance;Decreased range of motion;Decreased strength;Pain   Rehab Potential Good   PT Frequency 2x / week   PT Duration 6 weeks   PT Treatment/Interventions Patient/family education;Gait training;Stair training;Functional mobility training;Therapeutic activities;Therapeutic exercise;Manual techniques   PT Next Visit Plan Reviewed compliance with tband HEP, answer questions as needed.  Continue stretches and seated BAPS board, progress to standing as tolerated.  Continue with if pt is not have improvements begin iontophoresis (already have order).   PT Home Exercise Plan given   Consulted and Agree with Plan of Care Patient        Problem List Patient Active Problem List   Diagnosis Date Noted  . Cholecystitis, acute 06/13/2013  . Cholecystitis with cholelithiasis 06/13/2013    Deniece Ree PT, DPT [place] [address] [place], [place], [address] Phone: [phone]   Fax:  [phone]

## 2014-08-24 ENCOUNTER — Ambulatory Visit (HOSPITAL_COMMUNITY): Payer: 59 | Admitting: Physical Therapy

## 2014-08-24 DIAGNOSIS — M79671 Pain in right foot: Secondary | ICD-10-CM | POA: Diagnosis not present

## 2014-08-24 DIAGNOSIS — M25671 Stiffness of right ankle, not elsewhere classified: Secondary | ICD-10-CM

## 2014-08-24 DIAGNOSIS — R262 Difficulty in walking, not elsewhere classified: Secondary | ICD-10-CM

## 2014-08-24 DIAGNOSIS — R29898 Other symptoms and signs involving the musculoskeletal system: Secondary | ICD-10-CM

## 2014-08-24 DIAGNOSIS — G8929 Other chronic pain: Secondary | ICD-10-CM

## 2014-08-24 NOTE — Therapy (Signed)
[place], [place], [address] Phone: [phone]   Fax:  [phone]  Physical Therapy Treatment  Patient Details  Name: Pamela Mathis MRN: [id number] Date of Birth: 04-25-1980 Referring Provider:  Lonna Cobb, DPM  Encounter Date: 08/24/2014      PT End of Session - 08/24/14 1000    Visit Number 4   Number of Visits 12   Date for PT Re-Evaluation 09/14/14   PT Start Time 0933   PT Stop Time 1015   PT Time Calculation (min) 42 min   Activity Tolerance Patient tolerated treatment well   Behavior During Therapy [facility] for tasks assessed/performed      Past Medical History  Diagnosis Date  . No pertinent past medical history   . Gastroesophageal reflux   . Cholecystitis   . VXYIAXKP(537.4)     Past Surgical History  Procedure Laterality Date  . Wisdom tooth extraction    . Cesarean section  12/19/2011    Procedure: CESAREAN SECTION;  Surgeon: Cyril Mourning, MD;  Location: [facility];  Service: Obstetrics;  Laterality: N/A;  . Cholecystectomy N/A 06/16/2013    Procedure: LAPAROSCOPIC CHOLECYSTECTOMY;  Surgeon: Jamesetta So, MD;  Location: [facility];  Service: General;  Laterality: N/A;    There were no vitals filed for this visit.  Visit Diagnosis:  Heel pain, chronic, right  Stiffness of ankle joint, right  Difficulty walking  Weakness of foot, right      Subjective Assessment - 08/24/14 0959    Subjective Pt states her achilles tendon is so much better, however her arch is still hurting.    Currently in Pain? Yes   Pain Score 2    Pain Location Foot   Pain Orientation Right                         [facility] Adult PT Treatment/Exercise - 08/24/14 0933    Modalities   Modalities Iontophoresis   Iontophoresis   Type of Iontophoresis Dexamethasone   Location plantar heel Rt foot   Dose 2cc   Time 93minutes   Manual Therapy   Manual Therapy Soft tissue mobilization;Joint  mobilization   Manual therapy comments Rt gastroc/soleus complex, plantar surface of foot    Joint Mobilization Grade 2-3 mobilizations of midfoot, calcaneous, talus as tolerated    Ankle Exercises: Stretches   Plantar Fascia Stretch 3 reps;30 seconds   Gastroc Stretch 3 reps;30 seconds   Gastroc Stretch Limitations slantboard    Ankle Exercises: Standing   BAPS Standing;Level 3;Other (comment)   Rocker Board 1 minute                PT Education - 08/24/14 1006    Education provided Yes   Education Details Pt given ionto precautions handout prior to application and patient without concerns or questions and with agreement to treatment   Person(s) Educated Patient   Methods Handout;Explanation   Comprehension Verbalized understanding          PT Short Term Goals - 08/17/14 1009    PT SHORT TERM GOAL #1   Title I HEP   Status On-going   PT SHORT TERM GOAL #2   Title Pt to be able to tolerate standing for 30 minutes with boot on without incresed pain   PT SHORT TERM GOAL #3   Title Pt to be able to tolerate walking with boot on for 45 minutes  PT SHORT TERM GOAL #4   Title Pt to be weaned from boot   Status On-going           PT Long Term Goals - 08/17/14 1009    PT LONG TERM GOAL #1   Title I in HEP   PT LONG TERM GOAL #2   Title Pt to be able to tolerate standing for 45 minutes withiout her boot on    PT LONG TERM GOAL #3   Title Pt to be able to walk for an hour without her boot on   PT LONG TERM GOAL #4   Title Pt pain to be no greater than a 2/10 with functional activities   PT LONG TERM GOAL #5   Title Pt to begin a walking program/ exercising for better health.                Plan - 08/24/14 1000    Clinical Impression Statement Overall improvement in ankle pain, however continued plantar fascia discomfort.  Added iontophoresis treatment with dexamethasone to plantar medial aspect of foot after completion of manual focusing on this area as  well.  Pt given ionto precautions handout prior to application and patient without concerns or questions and with agreement to treatment.    PT Next Visit Plan Continue to progress per PT POC wtih concentration on decreasing pain and increasing function.  Check if  iontophoresis treatment helped next visit.    PT Home Exercise Plan given   Consulted and Agree with Plan of Care Patient        Problem List Patient Active Problem List   Diagnosis Date Noted  . Cholecystitis, acute 06/13/2013  . Cholecystitis with cholelithiasis 06/13/2013    Teena Irani, PTA/CLT [phone]  08/24/2014, 10:14 AM  [place], [place], [address] Phone: [phone]   Fax:  [phone]

## 2014-08-30 ENCOUNTER — Ambulatory Visit (HOSPITAL_COMMUNITY): Payer: 59 | Admitting: Physical Therapy

## 2014-08-30 ENCOUNTER — Ambulatory Visit (HOSPITAL_COMMUNITY): Payer: 59 | Attending: Foot & Ankle Surgery | Admitting: Physical Therapy

## 2014-08-30 DIAGNOSIS — G8929 Other chronic pain: Secondary | ICD-10-CM | POA: Insufficient documentation

## 2014-08-30 DIAGNOSIS — M79671 Pain in right foot: Secondary | ICD-10-CM | POA: Insufficient documentation

## 2014-08-30 DIAGNOSIS — M2141 Flat foot [pes planus] (acquired), right foot: Secondary | ICD-10-CM | POA: Diagnosis present

## 2014-08-30 DIAGNOSIS — M25671 Stiffness of right ankle, not elsewhere classified: Secondary | ICD-10-CM | POA: Insufficient documentation

## 2014-08-30 DIAGNOSIS — R29898 Other symptoms and signs involving the musculoskeletal system: Secondary | ICD-10-CM

## 2014-08-30 DIAGNOSIS — R262 Difficulty in walking, not elsewhere classified: Secondary | ICD-10-CM | POA: Diagnosis present

## 2014-08-30 NOTE — Therapy (Signed)
[place], [place], [address] Phone: [phone]   Fax:  [phone]  Physical Therapy Treatment  Patient Details  Name: Pamela Mathis MRN: [id number] Date of Birth: [birth year] Referring Provider:  Glenda Chroman., MD  Encounter Date: 08/30/2014      PT End of Session - 08/30/14 1614    Visit Number 5   Number of Visits 12   Date for PT Re-Evaluation 09/14/14   PT Start Time 1518   PT Stop Time 1602   PT Time Calculation (min) 44 min   Activity Tolerance Patient tolerated treatment well   Behavior During Therapy [facility] for tasks assessed/performed      Past Medical History  Diagnosis Date  . No pertinent past medical history   . Gastroesophageal reflux   . Cholecystitis   . MLYYTKPT(465.6)     Past Surgical History  Procedure Laterality Date  . Wisdom tooth extraction    . Cesarean section  12/19/2011    Procedure: CESAREAN SECTION;  Surgeon: Cyril Mourning, MD;  Location: [place] [facility];  Service: Obstetrics;  Laterality: N/A;  . Cholecystectomy N/A 06/16/2013    Procedure: LAPAROSCOPIC CHOLECYSTECTOMY;  Surgeon: Jamesetta So, MD;  Location: [facility];  Service: General;  Laterality: N/A;    There were no vitals filed for this visit.  Visit Diagnosis:  Stiffness of ankle joint, right  Difficulty walking  Weakness of foot, right  Heel pain, chronic, right      Subjective Assessment - 08/30/14 1613    Subjective Pt states she could not tell a big difference after the first ionto treatment.  Pt statess he was up on her feet alolt over the weekend so may have not helped anyway.   Currently in Pain? Yes   Pain Score 3    Pain Location Foot   Pain Orientation Right                         [facility] Adult PT Treatment/Exercise - 08/30/14 1558    Modalities   Modalities Iontophoresis   Iontophoresis   Type of Iontophoresis Dexamethasone   Location plantar heel Rt foot   Dose 2cc   Time  65minutes   Manual Therapy   Manual Therapy Soft tissue mobilization;Joint mobilization   Manual therapy comments Rt gastroc/soleus complex, plantar surface of foot    Joint Mobilization Grade 2-3 mobilizations of midfoot, calcaneous, talus as tolerated    Ankle Exercises: Stretches   Plantar Fascia Stretch 3 reps;30 seconds   Gastroc Stretch 3 reps;30 seconds   Gastroc Stretch Limitations slantboard    Ankle Exercises: Standing   BAPS Standing;Level 3;Other (comment)   SLS single leg balance reach 10 reps Rt LE only A/P                  PT Short Term Goals - 08/17/14 1009    PT SHORT TERM GOAL #1   Title I HEP   Status On-going   PT SHORT TERM GOAL #2   Title Pt to be able to tolerate standing for 30 minutes with boot on without incresed pain   PT SHORT TERM GOAL #3   Title Pt to be able to tolerate walking with boot on for 45 minutes    PT SHORT TERM GOAL #4   Title Pt to be weaned from boot   Status On-going           PT  Long Term Goals - 08/17/14 1009    PT LONG TERM GOAL #1   Title I in HEP   PT LONG TERM GOAL #2   Title Pt to be able to tolerate standing for 45 minutes withiout her boot on    PT LONG TERM GOAL #3   Title Pt to be able to walk for an hour without her boot on   PT LONG TERM GOAL #4   Title Pt pain to be no greater than a 2/10 with functional activities   PT LONG TERM GOAL #5   Title Pt to begin a walking program/ exercising for better health.                Plan - 08/30/14 1604    Clinical Impression Statement continued with plantar fascia stretches and strengthening exercises for Rt foot.  Treatment #2 of dexamethasone completed to medial aspect of Rt arch.  encouraged patient to check into different footwear wtih more support.   PT Next Visit Plan Continue to progress per PT POC wtih concentration on decreasing pain and increasing function.  Check if  iontophoresis treatment helped next visit.    PT Home Exercise Plan given    Consulted and Agree with Plan of Care Patient        Problem List Patient Active Problem List   Diagnosis Date Noted  . Cholecystitis, acute 06/13/2013  . Cholecystitis with cholelithiasis 06/13/2013    Teena Irani, PTA/CLT [phone]  08/30/2014, 4:15 PM  [place] [address] [place], [place], [address] Phone: [phone]   Fax:  [phone]

## 2014-09-01 ENCOUNTER — Ambulatory Visit (HOSPITAL_COMMUNITY): Payer: 59 | Admitting: Physical Therapy

## 2014-09-01 DIAGNOSIS — M79671 Pain in right foot: Secondary | ICD-10-CM

## 2014-09-01 DIAGNOSIS — M25671 Stiffness of right ankle, not elsewhere classified: Secondary | ICD-10-CM | POA: Diagnosis not present

## 2014-09-01 DIAGNOSIS — G8929 Other chronic pain: Secondary | ICD-10-CM

## 2014-09-01 DIAGNOSIS — R262 Difficulty in walking, not elsewhere classified: Secondary | ICD-10-CM

## 2014-09-01 DIAGNOSIS — R29898 Other symptoms and signs involving the musculoskeletal system: Secondary | ICD-10-CM

## 2014-09-01 NOTE — Therapy (Signed)
[place], [place], [address] Phone: [phone]   Fax:  [phone]  Physical Therapy Treatment  Patient Details  Name: Pamela Mathis MRN: [id number] Date of Birth: 11/08/1980 Referring Provider:  Glenda Chroman., MD  Encounter Date: 09/01/2014      PT End of Session - 09/01/14 1035    Visit Number 6   Number of Visits 12   Date for PT Re-Evaluation 09/14/14   PT Start Time 0932   PT Stop Time 1030   PT Time Calculation (min) 58 min   Activity Tolerance Patient tolerated treatment well   Behavior During Therapy [facility] for tasks assessed/performed      Past Medical History  Diagnosis Date  . No pertinent past medical history   . Gastroesophageal reflux   . Cholecystitis   . XLKGMWNU(272.5)     Past Surgical History  Procedure Laterality Date  . Wisdom tooth extraction    . Cesarean section  12/19/2011    Procedure: CESAREAN SECTION;  Surgeon: Cyril Mourning, MD;  Location: [place] [facility];  Service: Obstetrics;  Laterality: N/A;  . Cholecystectomy N/A 06/16/2013    Procedure: LAPAROSCOPIC CHOLECYSTECTOMY;  Surgeon: Jamesetta So, MD;  Location: [facility];  Service: General;  Laterality: N/A;    There were no vitals filed for this visit.  Visit Diagnosis:  Stiffness of ankle joint, right  Difficulty walking  Weakness of foot, right  Heel pain, chronic, right      Subjective Assessment - 09/01/14 1249    Subjective Pt states her pain is about the same, however she has been up on it more.  States she was having sharp pain in her plantar medial arch while in a seated position yesterday.     Currently in Pain? Yes   Pain Score 3    Pain Location Foot   Pain Orientation Right                         [facility] Adult PT Treatment/Exercise - 09/01/14 0937    Modalities   Modalities Iontophoresis;Ultrasound   Ultrasound   Ultrasound Location plantar fascia (medial side prior to STM and ionto)    Ultrasound Parameters 1.2 w/cm 2 continuous   Ultrasound Goals Pain   Iontophoresis   Type of Iontophoresis Dexamethasone   Location plantar heel Rt foot   Dose 2cc   Time 30minutes   Manual Therapy   Manual Therapy Soft tissue mobilization;Joint mobilization   Manual therapy comments Rt gastroc/soleus complex, plantar surface of foot    Joint Mobilization Grade 2-3 mobilizations of midfoot, calcaneous, talus as tolerated    Ankle Exercises: Stretches   Plantar Fascia Stretch 3 reps;30 seconds   Gastroc Stretch 3 reps;30 seconds   Gastroc Stretch Limitations slantboard    Ankle Exercises: Standing   SLS single leg balance reach 10 reps Rt LE only A/P                  PT Short Term Goals - 08/17/14 1009    PT SHORT TERM GOAL #1   Title I HEP   Status On-going   PT SHORT TERM GOAL #2   Title Pt to be able to tolerate standing for 30 minutes with boot on without incresed pain   PT SHORT TERM GOAL #3   Title Pt to be able to tolerate walking with boot on for 45 minutes    PT SHORT TERM  GOAL #4   Title Pt to be weaned from boot   Status On-going           PT Long Term Goals - 08/17/14 1009    PT LONG TERM GOAL #1   Title I in HEP   PT LONG TERM GOAL #2   Title Pt to be able to tolerate standing for 45 minutes withiout her boot on    PT LONG TERM GOAL #3   Title Pt to be able to walk for an hour without her boot on   PT LONG TERM GOAL #4   Title Pt pain to be no greater than a 2/10 with functional activities   PT LONG TERM GOAL #5   Title Pt to begin a walking program/ exercising for better health.                Plan - 09/01/14 1250    Clinical Impression Statement Added [place] prior to manual treatment to further increase blood flow to plantar fascia.  Treatment #3 of dexamethasone administered to plantar medial aspect of Rt arch.  Encouraged patient to attempt using her night splint to additionally stretch the plantar fascia and to check on her  footwear.    PT Next Visit Plan Continue to progress per PT POC wtih concentration on decreasing pain and increasing function.  Continue with 3 more ionto treatments.   PT Home Exercise Plan given   Consulted and Agree with Plan of Care Patient        Problem List Patient Active Problem List   Diagnosis Date Noted  . Cholecystitis, acute 06/13/2013  . Cholecystitis with cholelithiasis 06/13/2013    Teena Irani, PTA/CLT [phone]  09/01/2014, 1:01 PM  [place] [address] [place], [place], [address] Phone: [phone]   Fax:  [phone]

## 2014-09-06 ENCOUNTER — Ambulatory Visit (HOSPITAL_COMMUNITY): Payer: 59 | Admitting: Physical Therapy

## 2014-09-06 DIAGNOSIS — R29898 Other symptoms and signs involving the musculoskeletal system: Secondary | ICD-10-CM

## 2014-09-06 DIAGNOSIS — G8929 Other chronic pain: Secondary | ICD-10-CM

## 2014-09-06 DIAGNOSIS — M25671 Stiffness of right ankle, not elsewhere classified: Secondary | ICD-10-CM

## 2014-09-06 DIAGNOSIS — M79671 Pain in right foot: Secondary | ICD-10-CM

## 2014-09-06 DIAGNOSIS — R262 Difficulty in walking, not elsewhere classified: Secondary | ICD-10-CM

## 2014-09-06 NOTE — Therapy (Signed)
[place], [place], [address] Phone: [phone]   Fax:  [phone]  Physical Therapy Treatment  Patient Details  Name: Pamela Mathis MRN: [id number] Date of Birth: 08/22/1980 Referring Provider:  Lonna Cobb, DPM  Encounter Date: 09/06/2014      PT End of Session - 09/06/14 1617    Visit Number 7   Number of Visits 12   Date for PT Re-Evaluation 09/14/14   PT Start Time 2902   PT Stop Time 1115   PT Time Calculation (min) 44 min   Activity Tolerance Patient tolerated treatment well      Past Medical History  Diagnosis Date  . No pertinent past medical history   . Gastroesophageal reflux   . Cholecystitis   . ZMCEYEMV(361.2)     Past Surgical History  Procedure Laterality Date  . Wisdom tooth extraction    . Cesarean section  12/19/2011    Procedure: CESAREAN SECTION;  Surgeon: Cyril Mourning, MD;  Location: [place] [facility];  Service: Obstetrics;  Laterality: N/A;  . Cholecystectomy N/A 06/16/2013    Procedure: LAPAROSCOPIC CHOLECYSTECTOMY;  Surgeon: Jamesetta So, MD;  Location: [facility];  Service: General;  Laterality: N/A;    There were no vitals filed for this visit.  Visit Diagnosis:  Stiffness of ankle joint, right  Difficulty walking  Weakness of foot, right  Heel pain, chronic, right      Subjective Assessment - 09/06/14 1611    Subjective Pt states the pain in her calf is about gone but she continues to have tenderness along the medial aspect of her heel in her foot    Currently in Pain? Yes   Pain Score 3    Pain Location Heel   Pain Orientation Right               [facility] Adult PT Treatment/Exercise - 09/06/14 0001    Modalities   Modalities Cryotherapy;Moist Heat;Ultrasound   Moist Heat Therapy   Number Minutes Moist Heat 12 Minutes   Moist Heat Location Other (comment)  calf   Cryotherapy   Cryotherapy Location Other (comment)  medial aspect of heel,(area is  swollen)   Type of Cryotherapy Ice massage   Ultrasound   Ultrasound Location two areas normal along gastro where tightness is felt and pulsed along medial aspect of heel    Ultrasound Parameters 1.3w/cm2 for heel; 1.5w/cm2 for tendon; heel x 5' ;tendonx 8"   Ultrasound Goals Edema;Pain   Manual Therapy   Manual Therapy Soft tissue mobilization;Joint mobilization   Manual therapy comments Rt gastroc/soleus complex, plantar surface of foot                   PT Short Term Goals - 08/17/14 1009    PT SHORT TERM GOAL #1   Title I HEP   Status On-going   PT SHORT TERM GOAL #2   Title Pt to be able to tolerate standing for 30 minutes with boot on without incresed pain   PT SHORT TERM GOAL #3   Title Pt to be able to tolerate walking with boot on for 45 minutes    PT SHORT TERM GOAL #4   Title Pt to be weaned from boot   Status On-going           PT Long Term Goals - 08/17/14 1009    PT LONG TERM GOAL #1   Title I in HEP   PT LONG  TERM GOAL #2   Title Pt to be able to tolerate standing for 45 minutes withiout her boot on    PT LONG TERM GOAL #3   Title Pt to be able to walk for an hour without her boot on   PT LONG TERM GOAL #4   Title Pt pain to be no greater than a 2/10 with functional activities   PT LONG TERM GOAL #5   Title Pt to begin a walking program/ exercising for better health.                Plan - 09/06/14 1618    Clinical Impression Statement discontinued iontophoresis as pt does not feel this has been any benefit.  Pt has multiple tight areas throughout tendon and gastrocnemius mm with manual and [place] used to decrease tension.  Noted swelling along medial aspect of heel with pulsed [place] and ice massage to this area to decrease swelling.  Pt is completing her exercises 3x a day at home.   Pt will benefit from skilled therapeutic intervention in order to improve on the following deficits Decreased activity tolerance;Decreased range of motion;Decreased  strength;Pain   PT Frequency 2x / week   PT Duration 6 weeks   PT Next Visit Plan Continue to progress per PT POC wtih concentration on decreasing pain and increasing function.         Problem List Patient Active Problem List   Diagnosis Date Noted  . Cholecystitis, acute 06/13/2013  . Cholecystitis with cholelithiasis 06/13/2013    Rayetta Humphrey, PT CLT [phone] 09/06/2014, 4:22 PM  [facility] [address] [place], [place], [address] Phone: [phone]   Fax:  [phone]

## 2014-09-08 ENCOUNTER — Ambulatory Visit (HOSPITAL_COMMUNITY): Payer: 59 | Admitting: Physical Therapy

## 2014-09-08 DIAGNOSIS — R262 Difficulty in walking, not elsewhere classified: Secondary | ICD-10-CM

## 2014-09-08 DIAGNOSIS — M25671 Stiffness of right ankle, not elsewhere classified: Secondary | ICD-10-CM

## 2014-09-08 DIAGNOSIS — M79671 Pain in right foot: Secondary | ICD-10-CM

## 2014-09-08 DIAGNOSIS — G8929 Other chronic pain: Secondary | ICD-10-CM

## 2014-09-08 DIAGNOSIS — R29898 Other symptoms and signs involving the musculoskeletal system: Secondary | ICD-10-CM

## 2014-09-08 NOTE — Therapy (Signed)
[place], [place], [address] Phone: [phone]   Fax:  [phone]  Physical Therapy Treatment  Patient Details  Name: Pamela Mathis MRN: [id number] Date of Birth: 21-Sep-1980 Referring Provider:  Lonna Cobb, DPM  Encounter Date: 09/08/2014      PT End of Session - 09/08/14 1606    Visit Number 8   Number of Visits 8   Date for PT Re-Evaluation 09/14/14   PT Start Time 1520   PT Stop Time 1600   PT Time Calculation (min) 40 min      Past Medical History  Diagnosis Date  . No pertinent past medical history   . Gastroesophageal reflux   . Cholecystitis   . GGYIRSWN(462.7)     Past Surgical History  Procedure Laterality Date  . Wisdom tooth extraction    . Cesarean section  12/19/2011    Procedure: CESAREAN SECTION;  Surgeon: Cyril Mourning, MD;  Location: [facility];  Service: Obstetrics;  Laterality: N/A;  . Cholecystectomy N/A 06/16/2013    Procedure: LAPAROSCOPIC CHOLECYSTECTOMY;  Surgeon: Jamesetta So, MD;  Location: [facility];  Service: General;  Laterality: N/A;    There were no vitals filed for this visit.  Visit Diagnosis:  Stiffness of ankle joint, right  Difficulty walking  Weakness of foot, right  Heel pain, chronic, right      Subjective Assessment - 09/08/14 1522    Subjective Pt continues to feel the same pain in her heel states the calf feels much better.    Pertinent History Pt has a pituitary tumor and is on medication for this.   Pt had her gall bladder out last year.    How long can you stand comfortably? able to stand for 20 minutes was 15.    How long can you walk comfortably? Pt is able to walk for 10 minutes without her boot    Currently in Pain? Yes  highest pain is in AM 8/10 pt does wear night splint    Pain Score 2    Pain Location Heel   Pain Orientation Right            [facility] PT Assessment - 09/08/14 0001    Assessment   Medical Diagnosis Rt  achilles tendon   Onset Date/Surgical Date 02/08/14   Hand Dominance Right   Next MD Visit 09/15/2014   Prior Therapy none   Precautions   Precautions None   Required Braces or Orthoses Other Brace/Splint   Other Brace/Splint camboot   Restrictions   Weight Bearing Restrictions No   Home Environment   Living Environment Private residence   Living Arrangements Spouse/significant other   Type of [place] to enter   Entrance Stairs-Number of Steps 2   Entrance Stairs-Rails None   Home Layout One level   Prior Function   Level of Independence Independent   Vocation Full time employment   Vocation Requirements [employer]- standing; pushing , pulling , lifting    Leisure paint; flower arrangements.    Cognition   Overall Cognitive Status Within Functional Limits for tasks assessed   Observation/Other Assessments   Focus on Therapeutic Outcomes (FOTO)  51 was 41    Functional Tests   Functional tests Single leg stance   Single Leg Stance   Comments Lt    AROM   Right Ankle Dorsiflexion 10  equal to LT    Right Ankle Plantar Flexion  50  was 38   Right Ankle Inversion 20   Right Ankle Eversion 18   Strength   Right Ankle Dorsiflexion 5/5  was 4/5   Right Ankle Plantar Flexion 5/5  was 4/5   Right Ankle Inversion 5/5  was 4/5   Right Ankle Eversion 5/5  was 4/5               [facility] Adult PT Treatment/Exercise - 09/08/14 0001    Modalities   Modalities Ultrasound   Ultrasound   Ultrasound Location 2 areas heel and gastroc soleus tendon junction.   Ultrasound Parameters medial heel 6' pulsed at .2w/cm2; tendon 1.5w/cm2 continuous x 8'   Ultrasound Goals Edema;Pain   Manual Therapy   Manual Therapy Soft tissue mobilization;Joint mobilization   Manual therapy comments Rt gastroc/soleus complex, plantar surface of foot                PT Short Term Goals - 09/08/14 1528    PT SHORT TERM GOAL #1   Title I HEP   Time 1   Period Weeks    Status New   PT SHORT TERM GOAL #2   Title Pt to be able to tolerate standing for 30 minutes with boot on without incresed pain   Time 2   Period Weeks   Status On-going   PT SHORT TERM GOAL #3   Title Pt to be able to tolerate walking with boot on for 45 minutes    Time 2   Period Weeks   Status On-going   PT SHORT TERM GOAL #4   Title Pt to be weaned from boot   Time 4   Period Weeks   Status On-going           PT Long Term Goals - 09/08/14 1529    PT LONG TERM GOAL #1   Title I in HEP   Time 6   Period Weeks   Status On-going   PT LONG TERM GOAL #2   Title Pt to be able to tolerate standing for 45 minutes withiout her boot on    Time 6   Period Weeks   Status On-going   PT LONG TERM GOAL #3   Title Pt to be able to walk for an hour without her boot on   Time 6   Period Weeks   Status On-going   PT LONG TERM GOAL #4   Title Pt pain to be no greater than a 2/10 with functional activities   Time 6   Period Weeks   Status On-going   PT LONG TERM GOAL #5   Title Pt to begin a walking program/ exercising for better health.    Time 6   Status Not Met               Plan - 09/08/14 1608    Clinical Impression Statement Pt ROM and strength are now normal.  The Pt is I in stretching, icing. Pt did not note any improvement with iontophoreses with heel pain.  Pt pain in gastrocsoleus junction is now gone although area remains tight.  Pt appears to have maximized benefit of skilled therapy at this time.    PT Next Visit Plan recommend discharge as pt pain in calf is now gone and there has been no change in the pain in patients hee.l.         Problem List Patient Active Problem List   Diagnosis Date Noted  . Cholecystitis,  acute 06/13/2013  . Cholecystitis with cholelithiasis 06/13/2013    Rayetta Humphrey, PT CLT [phone] 09/08/2014, 4:12 PM  [facility] [address] [place], [place], [address] Phone:  [phone]   Fax:  [phone]

## 2014-09-15 ENCOUNTER — Telehealth (HOSPITAL_COMMUNITY): Payer: Self-pay | Admitting: Physical Therapy

## 2014-09-15 ENCOUNTER — Ambulatory Visit (HOSPITAL_COMMUNITY): Payer: 59 | Admitting: Physical Therapy

## 2014-09-15 NOTE — Telephone Encounter (Signed)
Patient a no show for today's appointment. Called and spoke to her husband, who stated that the patient was resting in bed and that she really had been ill on her stomach most of the day and just hasn't been feeling good. Her husband reported that the patient was planning to attend next appointment. Advised husband regarding date and time of next appointment.  Deniece Ree PT, DPT [phone]

## 2014-09-20 ENCOUNTER — Ambulatory Visit (HOSPITAL_COMMUNITY): Payer: 59 | Admitting: Physical Therapy

## 2014-09-20 DIAGNOSIS — M25671 Stiffness of right ankle, not elsewhere classified: Secondary | ICD-10-CM

## 2014-09-20 DIAGNOSIS — R262 Difficulty in walking, not elsewhere classified: Secondary | ICD-10-CM

## 2014-09-20 DIAGNOSIS — G8929 Other chronic pain: Secondary | ICD-10-CM

## 2014-09-20 DIAGNOSIS — M79671 Pain in right foot: Secondary | ICD-10-CM

## 2014-09-20 DIAGNOSIS — R29898 Other symptoms and signs involving the musculoskeletal system: Secondary | ICD-10-CM

## 2014-09-20 NOTE — Therapy (Signed)
Pamela Mathis, [place], [address] Phone: [phone]   Fax:  [phone]  Physical Therapy Treatment (Re-assessment)  Patient Details  Name: Pamela Mathis MRN: [id number] Date of Birth: 1981-01-29 Referring Provider:  Lonna Cobb, DPM  Encounter Date: 09/20/2014      PT End of Session - 09/20/14 1712    Visit Number 9   Number of Visits 16   Date for PT Re-Evaluation 10/18/14   PT Start Time 1350   PT Stop Time 1429   PT Time Calculation (min) 39 min   Activity Tolerance Patient tolerated treatment well   Behavior During Therapy [facility] for tasks assessed/performed      Past Medical History  Diagnosis Date  . No pertinent past medical history   . Gastroesophageal reflux   . Cholecystitis   . SWHQPRFF(638.4)     Past Surgical History  Procedure Laterality Date  . Wisdom tooth extraction    . Cesarean section  12/19/2011    Procedure: CESAREAN SECTION;  Surgeon: Cyril Mourning, MD;  Location: [facility];  Service: Obstetrics;  Laterality: N/A;  . Cholecystectomy N/A 06/16/2013    Procedure: LAPAROSCOPIC CHOLECYSTECTOMY;  Surgeon: Jamesetta So, MD;  Location: [facility];  Service: General;  Laterality: N/A;    There were no vitals filed for this visit.  Visit Diagnosis:  Stiffness of ankle joint, right  Difficulty walking  Weakness of foot, right  Heel pain, chronic, right      Subjective Assessment - 09/20/14 1350    Subjective Patient reports she is still having some pain in her foot but the achilles area is much better   Pertinent History Pt has a pituitary tumor and is on medication for this.   Pt had her gall bladder out last year.    Currently in Pain? Yes   Pain Score 5    Pain Location Foot   Pain Orientation Right            [facility] PT Assessment - 09/20/14 0001    Observation/Other Assessments   Focus on Therapeutic Outcomes (FOTO)  47% limited    Functional Tests   Functional  tests Single leg stance   Single Leg Stance   Comments 60 seconds each on stable surface    AROM   Right Hip External Rotation  45   Right Hip Internal Rotation  40   Left Hip External Rotation  42   Left Hip Internal Rotation  43   Right Ankle Dorsiflexion 13   Right Ankle Plantar Flexion 50   Right Ankle Inversion 20   Right Ankle Eversion 19   Left Ankle Dorsiflexion 12   Left Ankle Plantar Flexion 50   Left Ankle Inversion 16   Left Ankle Eversion 20   Strength   Right Hip Flexion 4/5   Right Hip Extension 4/5   Right Hip ABduction 4/5   Left Hip Flexion 5/5   Left Hip Extension 4/5   Left Hip ABduction 4+/5   Right Knee Flexion 4+/5   Right Knee Extension 5/5   Left Knee Flexion 5/5   Left Knee Extension 5/5   Right Ankle Dorsiflexion 5/5   Right Ankle Plantar Flexion 3+/5   Right Ankle Inversion 5/5   Right Ankle Eversion 5/5   Left Ankle Dorsiflexion 5/5   Left Ankle Plantar Flexion 4/5   Left Ankle Inversion 5/5   Left Ankle Eversion 4+/5  [place] [facility] PT Treatment/Exercise - 09/20/14 0001    Ankle Exercises: Stretches   Gastroc Stretch 3 reps;30 seconds   Gastroc Stretch Limitations slantboard    Other Stretch hamstring stretch 3x30seconds 3way; piriformis stretch 3x30 seconds                 PT Education - 09/20/14 1711    Education provided Yes   Education Details updates to plan of care moving forward    Person(s) Educated Patient   Methods Explanation   Comprehension Verbalized understanding          PT Short Term Goals - 09/20/14 1423    PT SHORT TERM GOAL #1   Title I HEP   Time 2   Period Weeks   Status Achieved   PT SHORT TERM GOAL #2   Title Pt to be able to tolerate standing for 60 minutes with boot off without incresed pain   Time 2   Period Weeks   Status Revised   PT SHORT TERM GOAL #3   Title Pt to be able to tolerate walking with boot off for 45 minutes    Time 2   Period Weeks    Status Revised   PT SHORT TERM GOAL #4   Title Pt to be weaned from boot   Baseline 7/26- patient is not really wearing boot right now unless she is in severe pain    Time 2   Period Weeks   Status On-going           PT Long Term Goals - 09/20/14 1425    PT LONG TERM GOAL #1   Title I in advanced HEP   Time 4   Period Weeks   Status On-going   PT LONG TERM GOAL #2   Title Patient will demonstrate bilateral ankle DF of 20 degrees    Time 4   Period Weeks   Status New   PT LONG TERM GOAL #3   Title Patient will be able to perform all weight bearing functional tasks for unlmited amounts of time with boot off and pain no more than 1/10   Time 4   Period Weeks   Status New   PT LONG TERM GOAL #4   Title Patient will be able to maintain single leg stance on foam for at least 30 seconds with no increased pain each leg    PT LONG TERM GOAL #5   Title Pt to begin a walking program/ exercising for better health.    Time 6   Period Weeks   Status On-going               Plan - 09/20/14 1713    Clinical Impression Statement Patient returns for more skilled PT services after verbal order from MD to continue to attempt PT. Performed re-assessment today which did reveal some bilateral ankle stiffness and proximal muscle weakness today. The patient also reports taht while ionto did not seem to help, she did experience quite a bit of relief with ultrasound with heat to her foot. At this time recommend trialing a continuation of skilled PT services with focus on modalities to foot, manual to improve joint mobility and mechanics, and intermittent proximal muscle strengthening to reduce general weakness in kinetic chain. Patient may benefit from 8 more sessions of skilled PT services with DC if patient has not progressed or improved.    Pt will benefit from skilled therapeutic intervention in order to improve on the following  deficits Decreased activity tolerance;Decreased range of  motion;Decreased strength;Pain   Rehab Potential Good   PT Frequency 2x / week   PT Duration 4 weeks   PT Treatment/Interventions Patient/family education;Gait training;Stair training;Functional mobility training;Therapeutic activities;Therapeutic exercise;Manual techniques   PT Next Visit Plan Focus on modalities (mostly [place]) to foot, manual for joint mobility, development of proximal muscle strength HEP    PT Home Exercise Plan given   Consulted and Agree with Plan of Care Patient        Problem List Patient Active Problem List   Diagnosis Date Noted  . Cholecystitis, acute 06/13/2013  . Cholecystitis with cholelithiasis 06/13/2013    Physical Therapy Progress Note  Dates of Reporting Period: 09/08/14  to 09/20/14  Objective Reports of Subjective Statement: see above   Objective Measurements: see above   Goal Update: see above   Plan: see above   Reason Skilled Services are Required: ankle stiffness, pain, proximal muscle weakness  Deniece Ree PT, DPT [place], [place], [address] Phone: [phone]   Fax:  [phone]

## 2014-09-22 ENCOUNTER — Encounter (HOSPITAL_COMMUNITY): Payer: 59

## 2014-09-27 ENCOUNTER — Encounter (HOSPITAL_COMMUNITY): Payer: 59 | Admitting: Physical Therapy

## 2014-10-03 ENCOUNTER — Encounter (HOSPITAL_COMMUNITY): Payer: 59 | Admitting: Physical Therapy

## 2014-10-03 NOTE — Therapy (Signed)
[place], [place], [address] Phone: [phone]   Fax:  [phone]  Patient Details  Name: Pamela Mathis MRN: [id number] Date of Birth: 19-Apr-1980 Referring Provider:  Lonna Cobb, DPM  Encounter Date: 09/20/2014  PHYSICAL THERAPY DISCHARGE SUMMARY  Visits from Start of Care: 9  Current functional level related to goals / functional outcomes: Improved ROM, strength and gastroc pain continued bone spur pain   Remaining deficits: pain   Education / Equipment: HEP  Plan: Patient agrees to discharge.  Patient goals were partially met. Patient is being discharged due to the patient's request.  ?????       Pt has a new job and will not be able to continue coming to appointments.   Rayetta Humphrey, PT CLT [phone] 10/03/2014, 10:04 AM  [place], [place], [address] Phone: [phone]   Fax:  [phone]

## 2014-10-05 ENCOUNTER — Encounter (HOSPITAL_COMMUNITY): Payer: 59

## 2014-10-11 ENCOUNTER — Encounter (HOSPITAL_COMMUNITY): Payer: 59 | Admitting: Physical Therapy

## 2014-10-14 ENCOUNTER — Encounter (HOSPITAL_COMMUNITY): Payer: 59

## 2014-10-17 ENCOUNTER — Encounter (HOSPITAL_COMMUNITY): Payer: 59 | Admitting: Physical Therapy

## 2014-10-20 ENCOUNTER — Encounter (HOSPITAL_COMMUNITY): Payer: 59

## 2014-10-24 ENCOUNTER — Encounter (HOSPITAL_COMMUNITY): Payer: 59 | Admitting: Physical Therapy

## 2014-10-26 ENCOUNTER — Encounter (HOSPITAL_COMMUNITY): Payer: 59 | Admitting: Physical Therapy

## 2015-01-09 ENCOUNTER — Ambulatory Visit (INDEPENDENT_AMBULATORY_CARE_PROVIDER_SITE_OTHER): Payer: 59

## 2015-01-09 ENCOUNTER — Ambulatory Visit (INDEPENDENT_AMBULATORY_CARE_PROVIDER_SITE_OTHER): Payer: 59 | Admitting: Podiatry

## 2015-01-09 ENCOUNTER — Telehealth: Payer: Self-pay | Admitting: *Deleted

## 2015-01-09 ENCOUNTER — Ambulatory Visit: Payer: 59

## 2015-01-09 VITALS — BP 136/89 | HR 73 | Resp 16 | Ht 66.0 in | Wt 230.0 lb

## 2015-01-09 DIAGNOSIS — M722 Plantar fascial fibromatosis: Secondary | ICD-10-CM | POA: Diagnosis not present

## 2015-01-09 DIAGNOSIS — R52 Pain, unspecified: Secondary | ICD-10-CM

## 2015-01-09 DIAGNOSIS — T148 Other injury of unspecified body region: Secondary | ICD-10-CM

## 2015-01-09 DIAGNOSIS — T148XXA Other injury of unspecified body region, initial encounter: Secondary | ICD-10-CM

## 2015-01-09 MED ORDER — NAPROXEN 500 MG PO TABS: 500.0000 mg | ORAL_TABLET | Freq: Two times a day (BID) | ORAL | Status: DC

## 2015-01-09 NOTE — Patient Instructions (Signed)
I will order an MRI of your right ankle. After you get the MRI, go ahead and make a follow-up appointment.  If you have any further questions, please feel free to call me at [phone].

## 2015-01-09 NOTE — Progress Notes (Signed)
Subjective:    Patient ID: Pamela Mathis, female    DOB: 18-Nov-1980, 34 y.o.   MRN: [id number]  HPI  34 year old female presents the office if concerns arise foot pain which has been ongoing for approximate one year. She states that she has pre-existing of podiatrist for which she was treated for plantar fasciitis. She underwent 3 steroid injections to the area as well as around the prednisone, immobilization, orthotics. She also had MRI performed at that time which was in January of this year. She was then referred to another podiatrist for swelling her Achilles tendon for which she underwent physical therapy for 6 weeks. She later then followed up with Dr. Gershon Mussel who told her she may have a fractured heel spur. She is referred here for further evaluation possible surgical intervention. She states that she continues to get pain to her right foot mostly in the bottom of the foot mostly in the morning or at night. She also has pain in the outside portion of her ankle with standing for prolonged periods of time or walking. She denies any recent injury or trauma. No tingling or numbness. The pain does not wake her up at night. She does of the pain is limiting her lifestyle and she is having pain on daily basis which is becoming debilitating. No other complaints at this time.   Review of Systems  Constitutional: Positive for activity change and fatigue.  HENT: Positive for ear pain, sinus pressure and sneezing.   Respiratory: Positive for cough.   Musculoskeletal: Positive for back pain, joint swelling and gait problem.  Neurological: Positive for dizziness and light-headedness.       Objective:   Physical Exam General: AAO x3, NAD  Dermatological: Skin is warm, dry and supple bilateral. Nails x 10 are well manicured; remaining integument appears unremarkable at this time. There are no open sores, no preulcerative lesions, no rash or signs of infection present.  Vascular: Dorsalis Pedis artery and  Posterior Tibial artery pedal pulses are 2/4 bilateral with immedate capillary fill time. Pedal hair growth present. No varicosities and no lower extremity edema present bilateral. There is no pain with calf compression, swelling, warmth, erythema.   Neruologic: Grossly intact via light touch bilateral. Vibratory intact via tuning fork bilateral. Protective threshold with Semmes Wienstein monofilament intact to all pedal sites bilateral. Patellar and Achilles deep tendon reflexes 2+ bilateral. No Babinski or clonus noted bilateral.   Musculoskeletal: There is tenderness to palpation overlying the plantar medial tubercle of the calcaneus at the insertion the plantar fascia. There is no palmar course the plantar fascial in the arch of the foot. There is no pain with lateral compression of the calcaneus. There is no pain along the course last insertion the Achilles tendon. Is no defect noted within the Achilles tendon [place] Silos test is negative. There is tenderness along the course the ATFL on the right foot and there doesn't appear to be a slight increase in anterior drawer compared with contralateral extremity. Talar tilt test was negative. No palmar course the CFL or PTFL there is a decrease in medial arch height upon weightbearing with equinus present. There is no other areas of tenderness to bilateral lower extremities. o gross boney pedal deformities bilateral. No pain, crepitus, or limitation noted with foot and ankle range of motion bilateral. Muscular strength 5/5 in all groups tested bilateral.  Gait: Unassisted, Nonantalgic.      Assessment & Plan:  34 year old female with right ankle pain, possible ATFL tear;  plantar fasciitis -X-rays were obtained and reviewed with the patient. Is no definitive evidence of acute fracture or stress fracture. -Treatment options discussed including all alternatives, risks, and complications -Etiology of symptoms were discussed -Recommended continuing the CAM boot  for now. -Will obtain a new MRI as the symptoms the ankle started in the summer of this year after her last MRI. -Discussed the plantar fascial surgery and possible lateral ankle stabilization however we'll discuss this further pending MRI. -Prescribed naproxen.  -Follow-up after MRI or sooner if any problems arise. In the meantime, encouraged to call the office with any questions, concerns, change in symptoms. Follow-up with PCP for other issues mentioned in the ROS.   Celesta Gentile, DPM

## 2015-01-09 NOTE — Telephone Encounter (Addendum)
Orders faxed to [email or web address] prior authorization started for MRI 96295, dx T14.8.  PRIOR AUTHORIZATION from [facility] [id number], [place], EXPIRED 02/25/2015.

## 2015-01-21 ENCOUNTER — Ambulatory Visit
Admission: RE | Admit: 2015-01-21 | Discharge: 2015-01-21 | Disposition: A | Discharge status: Home / Self Care | Payer: 59 | Source: Ambulatory Visit | Attending: Podiatry | Admitting: Podiatry

## 2015-01-21 DIAGNOSIS — R52 Pain, unspecified: Secondary | ICD-10-CM

## 2015-01-23 ENCOUNTER — Telehealth: Payer: Self-pay | Admitting: *Deleted

## 2015-01-23 NOTE — Telephone Encounter (Addendum)
-----   Message from Trula Slade, DPM sent at 01/22/2015  5:45 PM EST ----- Can you pleas let her know that the MRI does show plantar fasciitis, which has worsened since her previous exam. We can discuss further treatment options at her appointment next week. Thanks. Left message informing pt of results.

## 2015-01-30 ENCOUNTER — Encounter: Payer: Self-pay | Admitting: Podiatry

## 2015-01-30 ENCOUNTER — Ambulatory Visit (INDEPENDENT_AMBULATORY_CARE_PROVIDER_SITE_OTHER): Payer: 59 | Admitting: Podiatry

## 2015-01-30 VITALS — BP 133/81 | HR 76 | Resp 18

## 2015-01-30 DIAGNOSIS — M779 Enthesopathy, unspecified: Secondary | ICD-10-CM

## 2015-01-30 DIAGNOSIS — M722 Plantar fascial fibromatosis: Secondary | ICD-10-CM

## 2015-01-30 MED ORDER — DICLOFENAC SODIUM 75 MG PO TBEC: 75.0000 mg | DELAYED_RELEASE_TABLET | Freq: Two times a day (BID) | ORAL | Status: DC

## 2015-01-30 NOTE — Patient Instructions (Signed)

## 2015-01-31 ENCOUNTER — Telehealth: Payer: Self-pay | Admitting: *Deleted

## 2015-01-31 NOTE — Telephone Encounter (Signed)
"  I'm calling to inform you that patient's surgery has been approved.  The authorization number is [id number].  You should receive a follow-up letter as well in the days to come.  I called and informed Pamela Mathis at [facility] of the authorization number.  Surgery is scheduled for 02/08/2015 for Endoscopic Plantar Fasciotomy and Heel Spur Resection right foot.

## 2015-01-31 NOTE — Progress Notes (Signed)
Patient ID: Pamela Mathis, female   DOB: [birth year], 34 y.o.   MRN: [id number]  Subjective: 34 year old female presents the office today for follow-up evaluation of right ankle pain as well as plantar fasciitis. She states the majority of her pain continues in the plantar aspect of the heel. After the pain in the heel started she developed pain to her ankle likely from compensation. She is continuing stretching icing exercises and she states this has been ongoing for the last several months. She is also attended multiple conservative treatments including steroid injections, is oral steroids, mobilization, orthotics but a relief of symptoms. At this time pending the MRI evaluation she was discussed surgical intervention for that heel pain. No other complaints.  Objective: General: AAO x3, NAD  Dermatological: Skin is warm, dry and supple bilateral. Nails x 10 are well manicured; remaining integument appears unremarkable at this time. There are no open sores, no preulcerative lesions, no rash or signs of infection present.  Vascular: Dorsalis Pedis artery and Posterior Tibial artery pedal pulses are 2/4 bilateral with immedate capillary fill time. Pedal hair growth present. No varicosities and no lower extremity edema present bilateral. There is no pain with calf compression, swelling, warmth, erythema.   Neruologic: Grossly intact via light touch bilateral. Vibratory intact via tuning fork bilateral. Protective threshold with Semmes Wienstein monofilament intact to all pedal sites bilateral. Patellar and Achilles deep tendon reflexes 2+ bilateral. No Babinski or clonus noted bilateral.   Musculoskeletal: There is tenderness to palpation over the plantar medial tubercle of the calcaneus at the insertion the plantar fascia on the right foot. There is no pain on the course of plantar fashion the plantar fascia appears to be intact. There is no pain with lateral compression of the calcaneus. No pain along  the course of the Achilles tendon. There is mild discomfort along the course of the peroneal tendons into the lateral aspect of the ankle although subjectively she states this pain started after the heel pain in the heel pain is most significant. This is likely due to result of compensation. MMT 5/5. There is no overlying edema, erythema, increase in warmth.  Gait: Unassisted, Nonantalgic.   Assessment: 34 year old female with right heel pain, plantar fasciitis likely resulting and compensation ankle pain.  Plan: -Treatment options discussed including all alternatives, risks, and complications -MRI findings were discussed the patient which did reveal changes at plantar fasciitis identified and appears to be slightly worse compared to prior study. Examination otherwise is unchanged. Ankle joint is unremarkable as well as the sinus tarsi. There is no fracture or stress fracture. -Due to these symptoms and findings should proceed with surgical intervention. Discussed with her endoscopic plantar fasciotomy with heel spur resection. Should proceed with this. -The incision placement as well as the postoperative course was discussed with the patient. I discussed risks of the surgery which include, but not limited to, infection, bleeding, pain, swelling, need for further surgery, delayed or nonhealing, painful or ugly scar, numbness or sensation changes, over/under correction, recurrence, transfer lesions, further deformity, hardware failure, DVT/PE, loss of toe/foot. Patient understands these risks and wishes to proceed with surgery. The surgical consent was reviewed with the patient all 3 pages were signed. No promises or guarantees were given to the outcome of the procedure. All questions were answered to the best of my ability. Before the surgery the patient was encouraged to call the office if there is any further questions. The surgery will be performed at the [facility] on an  outpatient basis.  Celesta Gentile, DPM

## 2015-02-06 ENCOUNTER — Telehealth: Payer: Self-pay | Admitting: *Deleted

## 2015-02-06 NOTE — Telephone Encounter (Signed)
Pt states Dr. Jacqualyn Posey mentioned an antibiotic before surgery.

## 2015-02-06 NOTE — Telephone Encounter (Signed)
I'm returning your call.  Dr. Jacqualyn Posey said he would give you a prescription for an antibiotic the day of surgery.  "Oh okay, I couldn't remember.  Thanks for calling me back."

## 2015-02-08 ENCOUNTER — Telehealth: Payer: Self-pay | Admitting: *Deleted

## 2015-02-08 ENCOUNTER — Encounter: Payer: Self-pay | Admitting: Podiatry

## 2015-02-08 DIAGNOSIS — M722 Plantar fascial fibromatosis: Secondary | ICD-10-CM | POA: Diagnosis not present

## 2015-02-08 DIAGNOSIS — M7731 Calcaneal spur, right foot: Secondary | ICD-10-CM | POA: Diagnosis not present

## 2015-02-08 NOTE — Telephone Encounter (Signed)
I got a call from the surgical center that you have left several messages that you are bleeding through your bandage.  Do you know who you left the message with?  "I left it on the nurse's voicemail.  I only called once."  How big is the area of blood?"  It's about the size of a quarter."  Okay, if it gets to be bigger than a 50 cent piece let us know.  Make sure you are elevating.  Do not take the bandage off because that can allow infection to set in.  "Okay, I'm relieved.  I guess I panicked when I saw the blood.  I couldn't remember if they said a little bleeding would occur or not."  Give [place] a call if you have any further questions or concerns.  You can reach an on-call doctor during evening hours via pager.  The number is located on your post-operative sheet. "Okay, thanks for calling."

## 2015-02-08 NOTE — Telephone Encounter (Signed)
Pt states the bleeding of her surgery foot is greater than a 50 cent piece. Dr. Jacqualyn Posey states come in now.  Pt states she will be here in 30 minutes.  Pt arrived 5:24pm, Dr. Jacqualyn Posey redressed right surgical foot.

## 2015-02-14 ENCOUNTER — Ambulatory Visit (INDEPENDENT_AMBULATORY_CARE_PROVIDER_SITE_OTHER): Payer: Commercial Managed Care - HMO

## 2015-02-14 ENCOUNTER — Ambulatory Visit (INDEPENDENT_AMBULATORY_CARE_PROVIDER_SITE_OTHER): Payer: Commercial Managed Care - HMO | Admitting: Podiatry

## 2015-02-14 VITALS — Temp 98.3°F

## 2015-02-14 DIAGNOSIS — M722 Plantar fascial fibromatosis: Secondary | ICD-10-CM

## 2015-02-14 DIAGNOSIS — Z9889 Other specified postprocedural states: Secondary | ICD-10-CM

## 2015-02-14 NOTE — Progress Notes (Signed)
Patient ID: Pamela Mathis, female   DOB: [birth year], 34 y.o.   MRN: [id number]  Subjective: Pamela Mathis is a 34 y.o. is seen today in office s/p  Right EPF and Heel spur resection preformed on 02/08/15. They state their pain is improving. She has decreased the amount of pain medication. She has continued with antibiotics. Denies any systemic complaints such as fevers, chills, nausea, vomiting. No calf pain, chest pain, shortness of breath.   Objective: General: No acute distress, AAOx3  DP/PT pulses palpable 2/4, CRT < 3 sec to all digits.  Protective sensation intact. Motor function intact.  Right foot: Incision is well coapted without any evidence of dehiscence and sutures intact. There is no surrounding erythema, ascending cellulitis, fluctuance, crepitus, malodor, drainage/purulence. There is milnima edema around the surgical site. There is mild pain along the surgical site.  No other areas of tenderness to bilateral lower extremities.  No other open lesions or pre-ulcerative lesions.  No pain with calf compression, swelling, warmth, erythema.   Assessment and Plan:  Status post right foot surgery, doing well with no complications   -Treatment options discussed including all alternatives, risks, and complications -Antibiotic ointment applied followed by DSD. Keep clean, dry, intact. -Ice/elevation -Pain medication as needed. -Sleep in night splint or CAM boot. Dispensed NS per her request. -Monitor for any clinical signs or symptoms of infection and DVT/PE and directed to call the office immediately should any occur or go to the ER. -Follow-up in 1 week for possible suture removal or sooner if any problems arise. In the meantime, encouraged to call the office with any questions, concerns, change in symptoms.   Celesta Gentile, DPM

## 2015-03-06 ENCOUNTER — Ambulatory Visit (INDEPENDENT_AMBULATORY_CARE_PROVIDER_SITE_OTHER): Payer: Commercial Managed Care - HMO | Admitting: Podiatry

## 2015-03-06 ENCOUNTER — Encounter: Payer: Self-pay | Admitting: Podiatry

## 2015-03-06 DIAGNOSIS — Z9889 Other specified postprocedural states: Secondary | ICD-10-CM

## 2015-03-06 DIAGNOSIS — M722 Plantar fascial fibromatosis: Secondary | ICD-10-CM

## 2015-03-06 NOTE — Progress Notes (Signed)
Patient ID: Pamela Mathis, female   DOB: 08-05-1980, 35 y.o.   MRN: [id number]  Subjective: Pamela Mathis is a 35 y.o. is seen today in office s/p right EPF and Heel spur resection preformed on 02/08/15. They state their pain is improving. She has been wearing the CAM boot. She was on her foot for quite some time last week and she did have a throbbing station but no sharp pain. She does continue sleep the night splint with a cam boot. Denies any systemic complaints such as fevers, chills, nausea, vomiting. No calf pain, chest pain, shortness of breath.   Objective: General: No acute distress, AAOx3  DP/PT pulses palpable 2/4, CRT < 3 sec to all digits.  Protective sensation intact. Motor function intact.  Right foot: Incision is well coapted without any evidence of dehiscence and sutures intact. There is no surrounding erythema, ascending cellulitis, fluctuance, crepitus, malodor, drainage/purulence. There is milnima edema around the surgical site. There is mild pain along the surgical site but appears to be decreasing. No other areas of tenderness to bilateral lower extremities.  No other open lesions or pre-ulcerative lesions.  No pain with calf compression, swelling, warmth, erythema.   Assessment and Plan:  Status post right foot surgery, doing well with no complications   -Treatment options discussed including all alternatives, risks, and complications -Sutures removed without complications. Antibiotic ointment applied followed by DSD. Keep clean, dry, intact. Construct shower tomorrow. -Ice/elevation -Pain medication as needed. -Sleep in night splint or CAM boot. Dispensed NS per her request. -Dispensed darco issue. She can start to transition to a Darco shoe and when she is ready back into a regular shoe. It is any increase in pain to return the CAM boot. -Monitor for any clinical signs or symptoms of infection and DVT/PE and directed to call the office immediately should any occur or  go to the ER. -Follow-up in 3-4 weeks or sooner if any problems arise. In the meantime, encouraged to call the office with any questions, concerns, change in symptoms.   Celesta Gentile, DPM

## 2015-03-16 ENCOUNTER — Telehealth: Payer: Self-pay | Admitting: *Deleted

## 2015-03-16 NOTE — Telephone Encounter (Signed)
She should wear the boot to work and gradually increase her activity. We can do vicodin for her if she would like. Also, please prescribe voltaren 75mg  BID

## 2015-03-16 NOTE — Telephone Encounter (Addendum)
Pt states she went back to work 2 days ago, now has swelling and pain in the surgery foot in the evenings.  Pt asked is that normal.  I told pt that even though she had been weight bearing at home, it was not like at work when she didn't get a choice as to when to sit down, and between that and the surgery foot still having to coop with day to day swelling there is still unseen surgical swelling.  I encouraged pt to continue to ice and elevate as needed for comfort, and take any remaining surgery pain medication at night to rest.  Pt states she is taking 1/2 Percocet because the Ibuprofen isn't touching the pain.  I told pt I would discuss with Dr. Jacqualyn Posey her need for rest and call with information concerning pain medication.  Pt states understanding and will continue the Ibuprofen and 1/2 Percocet as needed, and ice and elevation.  03/17/2015 - Informed pt of Dr. Leigh Aurora orders and pt states her husband will pick up the rxs.

## 2015-03-17 ENCOUNTER — Telehealth: Payer: Self-pay | Admitting: *Deleted

## 2015-03-17 MED ORDER — HYDROCODONE-ACETAMINOPHEN 5-325 MG PO TABS: 1.0000 | ORAL_TABLET | Freq: Every day | ORAL | Status: DC | PRN

## 2015-03-17 MED ORDER — DICLOFENAC SODIUM 75 MG PO TBEC: 75.0000 mg | DELAYED_RELEASE_TABLET | Freq: Two times a day (BID) | ORAL | Status: DC

## 2015-03-27 ENCOUNTER — Encounter: Payer: Self-pay | Admitting: Podiatry

## 2015-03-27 ENCOUNTER — Ambulatory Visit: Payer: Self-pay

## 2015-03-27 ENCOUNTER — Ambulatory Visit (INDEPENDENT_AMBULATORY_CARE_PROVIDER_SITE_OTHER): Payer: Commercial Managed Care - HMO | Admitting: Podiatry

## 2015-03-27 VITALS — BP 134/83 | HR 68 | Resp 14

## 2015-03-27 DIAGNOSIS — Z9889 Other specified postprocedural states: Secondary | ICD-10-CM

## 2015-03-27 DIAGNOSIS — M779 Enthesopathy, unspecified: Secondary | ICD-10-CM | POA: Diagnosis not present

## 2015-03-27 DIAGNOSIS — M79671 Pain in right foot: Secondary | ICD-10-CM

## 2015-03-27 DIAGNOSIS — M722 Plantar fascial fibromatosis: Secondary | ICD-10-CM

## 2015-03-27 NOTE — Patient Instructions (Addendum)
Plantar Fasciitis With Rehab The plantar fascia is a fibrous, ligament-like, soft-tissue structure that spans the bottom of the foot. Plantar fasciitis, also called heel spur syndrome, is a condition that causes pain in the foot due to inflammation of the tissue. SYMPTOMS   Pain and tenderness on the underneath side of the foot.  Pain that worsens with standing or walking. CAUSES  Plantar fasciitis is caused by irritation and injury to the plantar fascia on the underneath side of the foot. Common mechanisms of injury include:  Direct trauma to bottom of the foot.  Damage to a small nerve that runs under the foot where the main fascia attaches to the heel bone.  Stress placed on the plantar fascia due to bone spurs. RISK INCREASES WITH:   Activities that place stress on the plantar fascia (running, jumping, pivoting, or cutting).  Poor strength and flexibility.  Improperly fitted shoes.  Tight calf muscles.  Flat feet.  Failure to warm-up properly before activity.  Obesity. PREVENTION  Warm up and stretch properly before activity.  Allow for adequate recovery between workouts.  Maintain physical fitness:  Strength, flexibility, and endurance.  Cardiovascular fitness.  Maintain a health body weight.  Avoid stress on the plantar fascia.  Wear properly fitted shoes, including arch supports for individuals who have flat feet. PROGNOSIS  If treated properly, then the symptoms of plantar fasciitis usually resolve without surgery. However, occasionally surgery is necessary. RELATED COMPLICATIONS   Recurrent symptoms that may result in a chronic condition.  Problems of the lower back that are caused by compensating for the injury, such as limping.  Pain or weakness of the foot during push-off following surgery.  Chronic inflammation, scarring, and partial or complete fascia tear, occurring more often from repeated injections. TREATMENT  Treatment initially involves  the use of ice and medication to help reduce pain and inflammation. The use of strengthening and stretching exercises may help reduce pain with activity, especially stretches of the Achilles tendon. These exercises may be performed at home or with a therapist. Your caregiver may recommend that you use heel cups of arch supports to help reduce stress on the plantar fascia. Occasionally, corticosteroid injections are given to reduce inflammation. If symptoms persist for greater than 6 months despite non-surgical (conservative), then surgery may be recommended.  MEDICATION   If pain medication is necessary, then nonsteroidal anti-inflammatory medications, such as aspirin and ibuprofen, or other minor pain relievers, such as acetaminophen, are often recommended.  Do not take pain medication within 7 days before surgery.  Prescription pain relievers may be given if deemed necessary by your caregiver. Use only as directed and only as much as you need.  Corticosteroid injections may be given by your caregiver. These injections should be reserved for the most serious cases, because they may only be given a certain number of times. HEAT AND COLD  Cold treatment (icing) relieves pain and reduces inflammation. Cold treatment should be applied for 10 to 15 minutes every 2 to 3 hours for inflammation and pain and immediately after any activity that aggravates your symptoms. Use ice packs or massage the area with a piece of ice (ice massage).  Heat treatment may be used prior to performing the stretching and strengthening activities prescribed by your caregiver, physical therapist, or athletic trainer. Use a heat pack or soak the injury in warm water. SEEK IMMEDIATE MEDICAL CARE IF:  Treatment seems to offer no benefit, or the condition worsens.  Any medications produce adverse side effects.   EXERCISES RANGE OF MOTION (ROM) AND STRETCHING EXERCISES - Plantar Fasciitis (Heel Spur Syndrome) These exercises may  help you when beginning to rehabilitate your injury. Your symptoms may resolve with or without further involvement from your physician, physical therapist or athletic trainer. While completing these exercises, remember:   Restoring tissue flexibility helps normal motion to return to the joints. This allows healthier, less painful movement and activity.  An effective stretch should be held for at least 30 seconds.  A stretch should never be painful. You should only feel a gentle lengthening or release in the stretched tissue. RANGE OF MOTION - Toe Extension, Flexion  Sit with your right / left leg crossed over your opposite knee.  Grasp your toes and gently pull them back toward the top of your foot. You should feel a stretch on the bottom of your toes and/or foot.  Hold this stretch for __________ seconds.  Now, gently pull your toes toward the bottom of your foot. You should feel a stretch on the top of your toes and or foot.  Hold this stretch for __________ seconds. Repeat __________ times. Complete this stretch __________ times per day.  RANGE OF MOTION - Ankle Dorsiflexion, Active Assisted  Remove shoes and sit on a chair that is preferably not on a carpeted surface.  Place right / left foot under knee. Extend your opposite leg for support.  Keeping your heel down, slide your right / left foot back toward the chair until you feel a stretch at your ankle or calf. If you do not feel a stretch, slide your bottom forward to the edge of the chair, while still keeping your heel down.  Hold this stretch for __________ seconds. Repeat __________ times. Complete this stretch __________ times per day.  STRETCH - Gastroc, Standing  Place hands on wall.  Extend right / left leg, keeping the front knee somewhat bent.  Slightly point your toes inward on your back foot.  Keeping your right / left heel on the floor and your knee straight, shift your weight toward the wall, not allowing your  back to arch.  You should feel a gentle stretch in the right / left calf. Hold this position for __________ seconds. Repeat __________ times. Complete this stretch __________ times per day. STRETCH - Soleus, Standing  Place hands on wall.  Extend right / left leg, keeping the other knee somewhat bent.  Slightly point your toes inward on your back foot.  Keep your right / left heel on the floor, bend your back knee, and slightly shift your weight over the back leg so that you feel a gentle stretch deep in your back calf.  Hold this position for __________ seconds. Repeat __________ times. Complete this stretch __________ times per day. STRETCH - Gastrocsoleus, Standing  Note: This exercise can place a lot of stress on your foot and ankle. Please complete this exercise only if specifically instructed by your caregiver.   Place the ball of your right / left foot on a step, keeping your other foot firmly on the same step.  Hold on to the wall or a rail for balance.  Slowly lift your other foot, allowing your body weight to press your heel down over the edge of the step.  You should feel a stretch in your right / left calf.  Hold this position for __________ seconds.  Repeat this exercise with a slight bend in your right / left knee. Repeat __________ times. Complete this stretch __________ times   per day.  STRENGTHENING EXERCISES - Plantar Fasciitis (Heel Spur Syndrome)  These exercises may help you when beginning to rehabilitate your injury. They may resolve your symptoms with or without further involvement from your physician, physical therapist or athletic trainer. While completing these exercises, remember:   Muscles can gain both the endurance and the strength needed for everyday activities through controlled exercises.  Complete these exercises as instructed by your physician, physical therapist or athletic trainer. Progress the resistance and repetitions only as  guided. STRENGTH - Towel Curls  Sit in a chair positioned on a non-carpeted surface.  Place your foot on a towel, keeping your heel on the floor.  Pull the towel toward your heel by only curling your toes. Keep your heel on the floor.  If instructed by your physician, physical therapist or athletic trainer, add ____________________ at the end of the towel. Repeat __________ times. Complete this exercise __________ times per day. STRENGTH - Ankle Inversion  Secure one end of a rubber exercise band/tubing to a fixed object (table, pole). Loop the other end around your foot just before your toes.  Place your fists between your knees. This will focus your strengthening at your ankle.  Slowly, pull your big toe up and in, making sure the band/tubing is positioned to resist the entire motion.  Hold this position for __________ seconds.  Have your muscles resist the band/tubing as it slowly pulls your foot back to the starting position. Repeat __________ times. Complete this exercises __________ times per day.    This information is not intended to replace advice given to you by your health care provider. Make sure you discuss any questions you have with your health care provider.   Document Released: 02/11/2005 Document Revised: 06/28/2014 Document Reviewed: 05/26/2008 [employer] Interactive Patient Education 2016 [employer] Peroneal Tendinitis With Rehab Tendonitis is inflammation of a tendon. Inflammation of the tendons on the back of the outer ankle (peroneal tendons) is known as peroneal tendonitis. The peroneal tendons are responsible for connecting the muscles that allow you to stand on your tiptoes to the bones of the ankle. For this reason, peroneal tendonitis often causes pain when trying to complete such motions. Peroneal tendonitis often involves a tear (strain) of the peroneal tendons. Strains are classified into three categories. Grade 1 strains cause pain, but the tendon is not  lengthened. Grade 2 strains include a lengthened ligament, due to the ligament being stretched or partially ruptured. With grade 2 strains there is still function, although function may be decreased. Grade 3 strains involve a complete tear of the tendon or muscle, and function is usually impaired. SYMPTOMS   Pain, tenderness, swelling, warmth, or redness over the back of the outer side of the ankle, the outer part of the mid-foot, or the bottom of the arch.  Pain that gets worse with ankle motion (especially when pushing off or pushing down with the front of the foot), or when standing on the ball of the foot or pushing the foot outward.  Crackling sound (crepitation) when the tendon is moved or touched. CAUSES  Peroneal tendinitis occurs when injury to the peroneal tendons causes the body to respond with inflammation. Common causes of injury include:  An overuse injury, in which the groove behind the outer ankle (where the tendon is located) causes wear on the tendon.  A sudden stress placed on the tendon, such as from an increase in the intensity, frequency, or duration of training.  Direct hit (trauma) to the  tendon.  Return to activity too soon after a previous ankle injury. RISK INCREASES WITH:  Sports that require sudden, repetitive pushing off of the foot, such as jumping or quick starts.  Kicking and running sports, especially running down hills or long distances.  Poor strength and flexibility.  Previous injury to the foot, ankle, or leg. PREVENTION  Warm up and stretch properly before activity.  Allow for adequate recovery between workouts.  Maintain physical fitness:  Strength, flexibility, and endurance.  Cardiovascular fitness.  Complete rehabilitation after previous injury. PROGNOSIS  If treated properly, peroneal tendonitis usually heals within 6 weeks.  RELATED COMPLICATIONS  Longer healing time, if not properly treated or if not given enough time to  heal.  Recurring symptoms if activity is resumed too soon, with overuse, or when using poor technique.  If untreated, tendinitis may result in tendon rupture, requiring surgery. TREATMENT  Treatment first involves the use of ice and medicine to reduce pain and inflammation. The use of strengthening and stretching exercises may help reduce pain with activity. These exercises may be performed at unsuccessful, surgery to remove the inflamed tendon lining (sheath) may be advised.  MEDICATION   If pain medicine is needed, nonsteroidal anti-inflammatory medicines (aspirin and ibuprofen), or other minor pain relievers (acetaminophen), are often advised.  Do not take pain medicine for 7 days before surgery.  Prescription pain relievers may be given, if your caregiver thinks they are needed. Use only as directed and only as much as you need. HEAT AND COLD  Cold treatment (icing) should be applied for 10 to 15 minutes every 2 to 3 hours for inflammation and pain, and immediately after activity that aggravates your symptoms. Use ice packs or an ice massage.  Heat treatment may be used before performing stretching and strengthening activities prescribed by your caregiver, physical therapist, or athletic trainer. Use a heat pack or a warm water soak. SEEK MEDICAL CARE IF:  Symptoms get worse or do not improve in 2 to 4 weeks, despite treatment.  New, unexplained symptoms develop. (Drugs used in treatment may produce side effects.) EXERCISES RANGE OF MOTION (ROM) AND STRETCHING EXERCISES - Peroneal Tendinitis These exercises may help you when beginning to rehabilitate your injury. Your symptoms may resolve with or without further involvement from your physician, physical therapist or athletic trainer. While completing these exercises, remember:   Restoring tissue flexibility helps normal motion to return to the joints. This allows healthier, less painful movement and activity.  An effective stretch  should be held for at least 30 seconds.  A stretch should never be painful. You should only feel a gentle lengthening or release in the stretched tissue. RANGE OF MOTION - Ankle Eversion  Sit with your right / left ankle crossed over your opposite knee.  Grip your foot with your opposite hand, placing your thumb on the top of your foot and your fingers across the bottom of your foot.  Gently push your foot downward with a slight rotation, so your littlest toes rise slightly toward the ceiling.  You should feel a gentle stretch on the inside of your ankle. Hold the stretch for __________ seconds. Repeat __________ times. Complete this exercise __________ times per day.  RANGE OF MOTION - Ankle Inversion  Sit with your right / left ankle crossed over your opposite knee.  Grip your foot with your opposite hand, placing your thumb on the bottom of your foot and your fingers across the top of your foot.  Gently pull your  foot so the smallest toe comes toward you and your thumb pushes the inside of the ball of your foot away from you.  You should feel a gentle stretch on the outside of your ankle. Hold the stretch for __________ seconds. Repeat __________ times. Complete this exercise __________ times per day.  RANGE OF MOTION - Ankle Plantar Flexion  Sit with your right / left leg crossed over your opposite knee.  Use your opposite hand to pull the top of your foot and toes toward you.  You should feel a gentle stretch on the top of your foot and ankle. Hold this position for __________ seconds. Repeat __________ times. Complete __________ times per day.  STRETCH - Gastroc, Standing  Place your hands on a wall.  Extend your right / left leg behind you, keeping the front knee somewhat bent.  Slightly point your toes inward on your back foot.  Keeping your right / left heel on the floor and your knee straight, shift your weight toward the wall, not allowing your back to arch.  You  should feel a gentle stretch in the calf. Hold this position for __________ seconds. Repeat __________ times. Complete this stretch __________ times per day. STRETCH - Soleus, Standing  Place your hands on a wall.  Extend your right / left leg behind you, keeping the other knee somewhat bent.  Slightly point your toes inward on your back foot.  Keep your heel on the floor, bend your back knee, and slightly shift your weight over the back leg so that you feel a gentle stretch deep in your back calf.  Hold this position for __________ seconds. Repeat __________ times. Complete this stretch __________ times per day. STRETCH - Gastrocsoleus, Standing Note: This exercise can place a lot of stress on your foot and ankle. Please complete this exercise only if specifically instructed by your caregiver.   Place the ball of your right / left foot on a step, keeping your other foot firmly on the same step.  Hold on to the wall or a rail for balance.  Slowly lift your other foot, allowing your body weight to press your heel down over the edge of the step.  You should feel a stretch in your right / left calf.  Hold this position for __________ seconds.  Repeat this exercise with a slight bend in your knee. Repeat __________ times. Complete this stretch __________ times per day.  STRENGTHENING EXERCISES - Peroneal Tendinitis  These exercises may help you when beginning to rehabilitate your injury. They may resolve your symptoms with or without further involvement from your physician, physical therapist or athletic trainer. While completing these exercises, remember:   Muscles can gain both the endurance and the strength needed for everyday activities through controlled exercises.  Complete these exercises as instructed by your physician, physical therapist or athletic trainer. Increase the resistance and repetitions only as guided by your caregiver. STRENGTH - Dorsiflexors  Secure a rubber  exercise band or tubing to a fixed object (table, pole) and loop the other end around your right / left foot.  Sit on the floor facing the fixed object. The band should be slightly tense when your foot is relaxed.  Slowly draw your foot back toward you, using your ankle and toes.  Hold this position for __________ seconds. Slowly release the tension in the band and return your foot to the starting position. Repeat __________ times. Complete this exercise __________ times per day.  STRENGTH - Towel Curls  Sit in a chair, on a non-carpeted surface.  Place your foot on a towel, keeping your heel on the floor.  Pull the towel toward your heel only by curling your toes. Keep your heel on the floor.  If instructed by your physician, physical therapist or athletic trainer, add weight to the end of the towel. Repeat __________ times. Complete this exercise __________ times per day. STRENGTH - Ankle Eversion   Secure one end of a rubber exercise band or tubing to a fixed object (table, pole). Loop the other end around your foot, just before your toes.  Place your fists between your knees. This will focus your strengthening at your ankle.  Drawing the band across your opposite foot, away from the pole, slowly, pull your little toe out and up. Make sure the band is positioned to resist the entire motion.  Hold this position for __________ seconds.  Have your muscles resist the band, as it slowly pulls your foot back to the starting position. Repeat __________ times. Complete this exercise __________ times per day.    This information is not intended to replace advice given to you by your health care provider. Make sure you discuss any questions you have with your health care provider.   Document Released: 02/11/2005 Document Revised: 06/28/2014 Document Reviewed: 05/26/2008 [employer] Interactive Patient Education 2016 [employer].  Achilles Tendinitis With Rehab Achilles tendinitis is a  disorder of the Achilles tendon. The Achilles tendon connects the large calf muscles (Gastrocnemius and Soleus) to the heel bone (calcaneus). This tendon is sometimes called the heel cord. It is important for pushing-off and standing on your toes and is important for walking, running, or jumping. Tendinitis is often caused by overuse and repetitive microtrauma. SYMPTOMS  Pain, tenderness, swelling, warmth, and redness may occur over the Achilles tendon even at rest.  Pain with pushing off, or flexing or extending the ankle.  Pain that is worsened after or during activity. CAUSES   Overuse sometimes seen with rapid increase in exercise programs or in sports requiring running and jumping.  Poor physical conditioning (strength and flexibility or endurance).  Running sports, especially training running down hills.  Inadequate warm-up before practice or play or failure to stretch before participation.  Injury to the tendon. PREVENTION   Warm up and stretch before practice or competition.  Allow time for adequate rest and recovery between practices and competition.  Keep up conditioning.  Keep up ankle and leg flexibility.  Improve or keep muscle strength and endurance.  Improve cardiovascular fitness.  Use proper technique.  Use proper equipment (shoes, skates).  To help prevent recurrence, taping, protective strapping, or an adhesive bandage may be recommended for several weeks after healing is complete. PROGNOSIS   Recovery may take weeks to several months to heal.  Longer recovery is expected if symptoms have been prolonged.  Recovery is usually quicker if the inflammation is due to a direct blow as compared with overuse or sudden strain. RELATED COMPLICATIONS   Healing time will be prolonged if the condition is not correctly treated. The injury must be given plenty of time to heal.  Symptoms can reoccur if activity is resumed too soon.  Untreated, tendinitis may  increase the risk of tendon rupture requiring additional time for recovery and possibly surgery. TREATMENT   The first treatment consists of rest anti-inflammatory medication, and ice to relieve the pain.  Stretching and strengthening exercises after resolution of pain will likely help reduce the risk of recurrence. Referral  to a physical therapist or athletic trainer for further evaluation and treatment may be helpful.  A walking boot or cast may be recommended to rest the Achilles tendon. This can help break the cycle of inflammation and microtrauma.  Arch supports (orthotics) may be prescribed or recommended by your caregiver as an adjunct to therapy and rest.  Surgery to remove the inflamed tendon lining or degenerated tendon tissue is rarely necessary and has shown less than predictable results. MEDICATION   Nonsteroidal anti-inflammatory medications, such as aspirin and ibuprofen, may be used for pain and inflammation relief. Do not take within 7 days before surgery. Take these as directed by your caregiver. Contact your caregiver immediately if any bleeding, stomach upset, or signs of allergic reaction occur. Other minor pain relievers, such as acetaminophen, may also be used.  Pain relievers may be prescribed as necessary by your caregiver. Do not take prescription pain medication for longer than 4 to 7 days. Use only as directed and only as much as you need.  Cortisone injections are rarely indicated. Cortisone injections may weaken tendons and predispose to rupture. It is better to give the condition more time to heal than to use them. HEAT AND COLD  Cold is used to relieve pain and reduce inflammation for acute and chronic Achilles tendinitis. Cold should be applied for 10 to 15 minutes every 2 to 3 hours for inflammation and pain and immediately after any activity that aggravates your symptoms. Use ice packs or an ice massage.  Heat may be used before performing stretching and  strengthening activities prescribed by your caregiver. Use a heat pack or a warm soak. SEEK MEDICAL CARE IF:  Symptoms get worse or do not improve in 2 weeks despite treatment.  New, unexplained symptoms develop. Drugs used in treatment may produce side effects. EXERCISES RANGE OF MOTION (ROM) AND STRETCHING EXERCISES - Achilles Tendinitis  These exercises may help you when beginning to rehabilitate your injury. Your symptoms may resolve with or without further involvement from your physician, physical therapist or athletic trainer. While completing these exercises, remember:   Restoring tissue flexibility helps normal motion to return to the joints. This allows healthier, less painful movement and activity.  An effective stretch should be held for at least 30 seconds.  A stretch should never be painful. You should only feel a gentle lengthening or release in the stretched tissue. STRETCH - Gastroc, Standing   Place hands on wall.  Extend right / left leg, keeping the front knee somewhat bent.  Slightly point your toes inward on your back foot.  Keeping your right / left heel on the floor and your knee straight, shift your weight toward the wall, not allowing your back to arch.  You should feel a gentle stretch in the right / left calf. Hold this position for __________ seconds. Repeat __________ times. Complete this stretch __________ times per day. STRETCH - Soleus, Standing   Place hands on wall.  Extend right / left leg, keeping the other knee somewhat bent.  Slightly point your toes inward on your back foot.  Keep your right / left heel on the floor, bend your back knee, and slightly shift your weight over the back leg so that you feel a gentle stretch deep in your back calf.  Hold this position for __________ seconds. Repeat __________ times. Complete this stretch __________ times per day. STRETCH - Gastrocsoleus, Standing  Note: This exercise can place a lot of stress  on your foot and  ankle. Please complete this exercise only if specifically instructed by your caregiver.   Place the ball of your right / left foot on a step, keeping your other foot firmly on the same step.  Hold on to the wall or a rail for balance.  Slowly lift your other foot, allowing your body weight to press your heel down over the edge of the step.  You should feel a stretch in your right / left calf.  Hold this position for __________ seconds.  Repeat this exercise with a slight bend in your knee. Repeat __________ times. Complete this stretch __________ times per day.  STRENGTHENING EXERCISES - Achilles Tendinitis These exercises may help you when beginning to rehabilitate your injury. They may resolve your symptoms with or without further involvement from your physician, physical therapist or athletic trainer. While completing these exercises, remember:   Muscles can gain both the endurance and the strength needed for everyday activities through controlled exercises.  Complete these exercises as instructed by your physician, physical therapist or athletic trainer. Progress the resistance and repetitions only as guided.  You may experience muscle soreness or fatigue, but the pain or discomfort you are trying to eliminate should never worsen during these exercises. If this pain does worsen, stop and make certain you are following the directions exactly. If the pain is still present after adjustments, discontinue the exercise until you can discuss the trouble with your clinician. STRENGTH - Plantar-flexors   Sit with your right / left leg extended. Holding onto both ends of a rubber exercise band/tubing, loop it around the ball of your foot. Keep a slight tension in the band.  Slowly push your toes away from you, pointing them downward.  Hold this position for __________ seconds. Return slowly, controlling the tension in the band/tubing. Repeat __________ times. Complete this  exercise __________ times per day.  STRENGTH - Plantar-flexors   Stand with your feet shoulder width apart. Steady yourself with a wall or table using as little support as needed.  Keeping your weight evenly spread over the width of your feet, rise up on your toes.*  Hold this position for __________ seconds. Repeat __________ times. Complete this exercise __________ times per day.  *If this is too easy, shift your weight toward your right / left leg until you feel challenged. Ultimately, you may be asked to do this exercise with your right / left foot only. STRENGTH - Plantar-flexors, Eccentric  Note: This exercise can place a lot of stress on your foot and ankle. Please complete this exercise only if specifically instructed by your caregiver.   Place the balls of your feet on a step. With your hands, use only enough support from a wall or rail to keep your balance.  Keep your knees straight and rise up on your toes.  Slowly shift your weight entirely to your right / left toes and pick up your opposite foot. Gently and with controlled movement, lower your weight through your right / left foot so that your heel drops below the level of the step. You will feel a slight stretch in the back of your calf at the end position.  Use the healthy leg to help rise up onto the balls of both feet, then lower weight only on the right / left leg again. Build up to 15 repetitions. Then progress to 3 consecutive sets of 15 repetitions.*  After completing the above exercise, complete the same exercise with a slight knee bend (about 30 degrees).  Again, build up to 15 repetitions. Then progress to 3 consecutive sets of 15 repetitions.* Perform this exercise __________ times per day.  *When you easily complete 3 sets of 15, your physician, physical therapist or athletic trainer may advise you to add resistance by wearing a backpack filled with additional weight. STRENGTH - Plantar Flexors, Seated   Sit on a  chair that allows your feet to rest flat on the ground. If necessary, sit at the edge of the chair.  Keeping your toes firmly on the ground, lift your right / left heel as far as you can without increasing any discomfort in your ankle. Repeat __________ times. Complete this exercise __________ times a day. *If instructed by your physician, physical therapist or athletic trainer, you may add ____________________ of resistance by placing a weighted object on your right / left knee.   This information is not intended to replace advice given to you by your health care provider. Make sure you discuss any questions you have with your health care provider.   Document Released: 09/12/2004 Document Revised: 03/04/2014 Document Reviewed: 05/26/2008 Elsevier Interactive Patient Education [employer].

## 2015-03-29 NOTE — Progress Notes (Signed)
Patient ID: Pamela Mathis, female   DOB: February 09, 1981, 35 y.o.   MRN: [id number]  Subjective: Pamela Mathis is a 35 y.o. is seen today in office s/p right EPF and Heel spur resection preformed on 02/08/15.  She states that her pain is improving compared to what it was prior to surgery. She gets some pain  To her ankle with some mild swelling to her ankle at times however that heel pain has resolved. Denies any redness or warmth. She has been wearing a regular shoe. Denies any systemic complaints such as fevers, chills, nausea, vomiting. No calf pain, chest pain, shortness of breath.   Objective: General: No acute distress, AAOx3  DP/PT pulses palpable 2/4, CRT < 3 sec to all digits.  Protective sensation intact. Motor function intact.  Right foot: Incision is well coapted without any evidence of dehiscence and scar has formed. There is no surrounding erythema, ascending cellulitis, fluctuance, crepitus, malodor, drainage/purulence. There is minimal edema around the surgical site. There is mild pain along the surgical site but appears to be decreasing. There is mild tenderness to palpation the course of the peroneal tendons along the distal portion Of the lateral malleolus and proximal to the fifth metatarsal base. There is also mild discomfort along the Achilles tendon. No tearing present. Thompson test is negative. The peroneal tendons appeared to be intact. No other areas of tenderness to bilateral lower extremities.  No other open lesions or pre-ulcerative lesions.  No pain with calf compression, swelling, warmth, erythema.   Assessment and Plan:  Status post right foot surgery, doing well with no complications;  However with tendinitis  -Treatment options discussed including all alternatives, risks, and complications - recommend immobilization in ankle brace was dispensed today. Ice and elevation. Anti-inflammatories as needed. Continue voltaren.  -Ice/elevation -Pain medication as  needed. -Continue NS at night.  -Monitor for any clinical signs or symptoms of infection and DVT/PE and directed to call the office immediately should any occur or go to the ER. -Follow-up in 3-4 weeks or sooner if any problems arise. In the meantime, encouraged to call the office with any questions, concerns, change in symptoms.   Celesta Gentile, DPM

## 2015-04-24 ENCOUNTER — Encounter: Payer: Self-pay | Admitting: Podiatry

## 2015-04-24 ENCOUNTER — Ambulatory Visit (INDEPENDENT_AMBULATORY_CARE_PROVIDER_SITE_OTHER): Payer: Commercial Managed Care - HMO | Admitting: Podiatry

## 2015-04-24 VITALS — BP 115/72 | HR 73 | Resp 18

## 2015-04-24 DIAGNOSIS — M722 Plantar fascial fibromatosis: Secondary | ICD-10-CM

## 2015-04-24 DIAGNOSIS — Z9889 Other specified postprocedural states: Secondary | ICD-10-CM

## 2015-04-24 NOTE — Progress Notes (Signed)
Patient ID: Pamela Mathis, female   DOB: 11/22/1980, 35 y.o.   MRN: [id number]  Subjective: Pamela Mathis is a 35 y.o. is seen today in office s/p right EPF and Heel spur resection preformed on 02/08/15.  She states that she is doing better compared to last appointment she is able to wear regular shoe without any problems. She has noticed some intermittent swelling at times but overall is improving. Denies any warmth or redness to the area. She states that overall she feels that she is much improved compared to what she was prior to surgery and she is no longer getting the patient wishes having. She also states that she would proceed with orthotics today to help prevent recurrence and this will happen on the left side..  Denies any systemic complaints such as fevers, chills, nausea, vomiting. No calf pain, chest pain, shortness of breath.   Objective: General: No acute distress, AAOx3  DP/PT pulses palpable 2/4, CRT < 3 sec to all digits.  Protective sensation intact. Motor function intact.  Right foot: Incision is well coapted without any evidence of dehiscence and scar has formed. There is no surrounding erythema, ascending cellulitis, fluctuance, crepitus, malodor, drainage/purulence. There is trace edema around the surgical site. There is currently no pain along the surgical site or to the heel at this time. There is no tenderness to palpation the course of the peroneal tendons today. No other areas of tenderness to bilateral lower extremities.  No other open lesions or pre-ulcerative lesions.  No pain with calf compression, swelling, warmth, erythema.   Assessment and Plan:  Status post right foot surgery, doing well with no complications  -Treatment options discussed including all alternatives, risks, and complications -At this point she  Would like to go ahead and proceed with orthotics today. Her scans were sent to [facility] labs. Continue with stretching, icing exercises daily.  Anti-inflammatory as needed. Start to increase her activity gradually. -Follow-up in 3 weeks to [facility] or sooner if any problems arise. In the meantime, encouraged to call the office with any questions, concerns, change in symptoms.   Celesta Gentile, DPM

## 2015-05-03 NOTE — Telephone Encounter (Signed)
Entered in error

## 2015-05-16 ENCOUNTER — Ambulatory Visit: Payer: Commercial Managed Care - HMO | Admitting: *Deleted

## 2015-05-16 DIAGNOSIS — R52 Pain, unspecified: Secondary | ICD-10-CM

## 2015-05-16 NOTE — Patient Instructions (Signed)

## 2015-05-16 NOTE — Progress Notes (Signed)
Patient ID: Pamela Mathis, female   DOB: 09-29-1980, 35 y.o.   MRN: [id number] Patient presents for orthotic pick up.  Verbal and written break in and wear instructions given.  Patient will follow up in 4 weeks if symptoms worsen or fail to improve.

## 2015-07-03 ENCOUNTER — Ambulatory Visit (INDEPENDENT_AMBULATORY_CARE_PROVIDER_SITE_OTHER): Payer: Commercial Managed Care - HMO

## 2015-07-03 ENCOUNTER — Ambulatory Visit (INDEPENDENT_AMBULATORY_CARE_PROVIDER_SITE_OTHER): Payer: Commercial Managed Care - HMO | Admitting: Podiatry

## 2015-07-03 ENCOUNTER — Encounter: Payer: Self-pay | Admitting: Podiatry

## 2015-07-03 DIAGNOSIS — M25571 Pain in right ankle and joints of right foot: Secondary | ICD-10-CM

## 2015-07-03 DIAGNOSIS — S93401S Sprain of unspecified ligament of right ankle, sequela: Secondary | ICD-10-CM | POA: Diagnosis not present

## 2015-07-03 DIAGNOSIS — M79671 Pain in right foot: Secondary | ICD-10-CM

## 2015-07-03 DIAGNOSIS — G5751 Tarsal tunnel syndrome, right lower limb: Secondary | ICD-10-CM | POA: Diagnosis not present

## 2015-07-03 MED ORDER — METHYLPREDNISOLONE 4 MG PO TBPK: ORAL_TABLET | ORAL | Status: DC

## 2015-07-06 NOTE — Progress Notes (Signed)
Patient ID: Pamela Mathis, female   DOB: 07-Sep-1980, 35 y.o.   MRN: [id number]  Subjective: 72 she'll female presents the office today for pain to her right foot. She gets pain to offset of her foot for which she points the sinus tarsi into the lateral aspect of the ankle. She has had some of this discomfort prior to surgery. She states that she is doing well from the heel she's not having any heel pain however this is starting to become more bothersome. She gets and swelling to the ankle as well into the outside part of the foot. Denies any recent injury or trauma. No tingling or numbness. Pain is worse with walking. Denies any systemic complaints such as fevers, chills, nausea, vomiting. No acute changes since last appointment, and no other complaints at this time.   Objective: AAO x3, NAD DP/PT pulses palpable bilaterally, CRT less than 3 seconds Protective sensation intact with Simms Weinstein monofilament Incision from prior surgery are well-healed. There is no tenderness on the plantar aspect of the calcaneus or along the plantar fascia. There is tenderness on the sinus tarsi on the lateral aspect of the right foot. There is no pain or crepitation with subtalar joint range of motion however range of motion is somewhat restricted. There is also some mild discomfort on the course the ATFL. No pain on the CFL, PTFL. No pain on the medial ankle ligaments are syndesmosis. Ankle joint range of motion intact. No areas of pinpoint bony tenderness or pain with vibratory sensation. MMT 5/5, ROM WNL. No edema, erythema, increase in warmth to bilateral lower extremities.  No open lesions or pre-ulcerative lesions.  No pain with calf compression, swelling, warmth, erythema  Assessment: 35 year old female right sinus tarsi pain, ATFL pain  Plan: -All treatment options discussed with the patient including all alternatives, risks, complications.  -X-rays were obtained and reviewed. No evidence of acute  fracture or stress fracture. -Medrol Dosepak. Discussed steroid injection but she declined -Started has a prescription from [facility] at home and I recommended her to use this. She can start this after the Medrol Dosepak. -Ankle brace -Ice -Follow-up as scheduled -Patient encouraged to call the office with any questions, concerns, change in symptoms.   Celesta Gentile, DPM

## 2015-07-31 ENCOUNTER — Ambulatory Visit: Payer: Commercial Managed Care - HMO | Admitting: Podiatry

## 2015-11-07 NOTE — Progress Notes (Signed)
DOS 12.14.2016 Right foot endoscopic plantar fascia release;heel spur removal

## 2016-03-07 DIAGNOSIS — J028 Acute pharyngitis due to other specified organisms: Secondary | ICD-10-CM | POA: Diagnosis not present

## 2016-03-22 DIAGNOSIS — E221 Hyperprolactinemia: Secondary | ICD-10-CM | POA: Diagnosis not present

## 2016-03-22 DIAGNOSIS — D443 Neoplasm of uncertain behavior of pituitary gland: Secondary | ICD-10-CM | POA: Diagnosis not present

## 2016-03-27 ENCOUNTER — Other Ambulatory Visit: Payer: Self-pay | Admitting: Endocrinology

## 2016-03-27 DIAGNOSIS — E221 Hyperprolactinemia: Secondary | ICD-10-CM

## 2016-03-27 DIAGNOSIS — D443 Neoplasm of uncertain behavior of pituitary gland: Secondary | ICD-10-CM

## 2016-04-08 DIAGNOSIS — R05 Cough: Secondary | ICD-10-CM | POA: Diagnosis not present

## 2016-04-08 DIAGNOSIS — R300 Dysuria: Secondary | ICD-10-CM | POA: Diagnosis not present

## 2016-04-08 DIAGNOSIS — J0101 Acute recurrent maxillary sinusitis: Secondary | ICD-10-CM | POA: Diagnosis not present

## 2016-04-30 DIAGNOSIS — D352 Benign neoplasm of pituitary gland: Secondary | ICD-10-CM | POA: Diagnosis not present

## 2016-05-21 DIAGNOSIS — M722 Plantar fascial fibromatosis: Secondary | ICD-10-CM | POA: Diagnosis not present

## 2016-05-21 DIAGNOSIS — D352 Benign neoplasm of pituitary gland: Secondary | ICD-10-CM | POA: Diagnosis not present

## 2016-07-23 DIAGNOSIS — S93412A Sprain of calcaneofibular ligament of left ankle, initial encounter: Secondary | ICD-10-CM | POA: Diagnosis not present

## 2017-01-17 DIAGNOSIS — J0100 Acute maxillary sinusitis, unspecified: Secondary | ICD-10-CM | POA: Diagnosis not present

## 2017-02-07 DIAGNOSIS — J189 Pneumonia, unspecified organism: Secondary | ICD-10-CM | POA: Diagnosis not present

## 2017-02-20 DIAGNOSIS — S39012A Strain of muscle, fascia and tendon of lower back, initial encounter: Secondary | ICD-10-CM | POA: Diagnosis not present

## 2017-02-20 DIAGNOSIS — M545 Low back pain: Secondary | ICD-10-CM | POA: Diagnosis not present

## 2017-03-20 DIAGNOSIS — M545 Low back pain: Secondary | ICD-10-CM | POA: Diagnosis not present

## 2017-04-04 DIAGNOSIS — J0101 Acute recurrent maxillary sinusitis: Secondary | ICD-10-CM | POA: Diagnosis not present

## 2017-04-23 DIAGNOSIS — J0101 Acute recurrent maxillary sinusitis: Secondary | ICD-10-CM | POA: Diagnosis not present

## 2017-05-06 DIAGNOSIS — D443 Neoplasm of uncertain behavior of pituitary gland: Secondary | ICD-10-CM | POA: Diagnosis not present

## 2017-05-06 DIAGNOSIS — E221 Hyperprolactinemia: Secondary | ICD-10-CM | POA: Diagnosis not present

## 2017-05-13 DIAGNOSIS — E221 Hyperprolactinemia: Secondary | ICD-10-CM | POA: Diagnosis not present

## 2017-05-13 DIAGNOSIS — D443 Neoplasm of uncertain behavior of pituitary gland: Secondary | ICD-10-CM | POA: Diagnosis not present

## 2017-05-28 DIAGNOSIS — B029 Zoster without complications: Secondary | ICD-10-CM | POA: Diagnosis not present

## 2017-06-10 DIAGNOSIS — D443 Neoplasm of uncertain behavior of pituitary gland: Secondary | ICD-10-CM | POA: Diagnosis not present

## 2017-06-10 DIAGNOSIS — E221 Hyperprolactinemia: Secondary | ICD-10-CM | POA: Diagnosis not present

## 2017-08-18 DIAGNOSIS — M545 Low back pain: Secondary | ICD-10-CM | POA: Diagnosis not present

## 2017-11-18 DIAGNOSIS — M2550 Pain in unspecified joint: Secondary | ICD-10-CM | POA: Diagnosis not present

## 2017-11-18 DIAGNOSIS — Z6839 Body mass index (BMI) 39.0-39.9, adult: Secondary | ICD-10-CM | POA: Diagnosis not present

## 2017-11-18 DIAGNOSIS — M545 Low back pain: Secondary | ICD-10-CM | POA: Diagnosis not present

## 2017-11-19 DIAGNOSIS — M2550 Pain in unspecified joint: Secondary | ICD-10-CM | POA: Diagnosis not present

## 2017-11-19 DIAGNOSIS — M545 Low back pain: Secondary | ICD-10-CM | POA: Diagnosis not present

## 2018-02-03 DIAGNOSIS — J019 Acute sinusitis, unspecified: Secondary | ICD-10-CM | POA: Diagnosis not present

## 2018-02-03 DIAGNOSIS — Z6841 Body Mass Index (BMI) 40.0 and over, adult: Secondary | ICD-10-CM | POA: Diagnosis not present

## 2018-02-03 DIAGNOSIS — J069 Acute upper respiratory infection, unspecified: Secondary | ICD-10-CM | POA: Diagnosis not present

## 2018-02-27 DIAGNOSIS — S80861A Insect bite (nonvenomous), right lower leg, initial encounter: Secondary | ICD-10-CM | POA: Diagnosis not present

## 2018-02-27 DIAGNOSIS — W57XXXA Bitten or stung by nonvenomous insect and other nonvenomous arthropods, initial encounter: Secondary | ICD-10-CM | POA: Diagnosis not present

## 2018-02-27 DIAGNOSIS — L03115 Cellulitis of right lower limb: Secondary | ICD-10-CM | POA: Diagnosis not present

## 2018-03-23 DIAGNOSIS — Z6841 Body Mass Index (BMI) 40.0 and over, adult: Secondary | ICD-10-CM | POA: Diagnosis not present

## 2018-03-23 DIAGNOSIS — J019 Acute sinusitis, unspecified: Secondary | ICD-10-CM | POA: Diagnosis not present

## 2018-04-03 DIAGNOSIS — Z6841 Body Mass Index (BMI) 40.0 and over, adult: Secondary | ICD-10-CM | POA: Diagnosis not present

## 2018-04-03 DIAGNOSIS — J019 Acute sinusitis, unspecified: Secondary | ICD-10-CM | POA: Diagnosis not present

## 2018-04-03 DIAGNOSIS — H6980 Other specified disorders of Eustachian tube, unspecified ear: Secondary | ICD-10-CM | POA: Diagnosis not present

## 2018-04-14 DIAGNOSIS — R6883 Chills (without fever): Secondary | ICD-10-CM | POA: Diagnosis not present

## 2018-04-14 DIAGNOSIS — J029 Acute pharyngitis, unspecified: Secondary | ICD-10-CM | POA: Diagnosis not present

## 2018-04-14 DIAGNOSIS — J111 Influenza due to unidentified influenza virus with other respiratory manifestations: Secondary | ICD-10-CM | POA: Diagnosis not present

## 2018-04-21 DIAGNOSIS — J019 Acute sinusitis, unspecified: Secondary | ICD-10-CM | POA: Diagnosis not present

## 2018-04-21 DIAGNOSIS — J111 Influenza due to unidentified influenza virus with other respiratory manifestations: Secondary | ICD-10-CM | POA: Diagnosis not present

## 2018-04-21 DIAGNOSIS — R509 Fever, unspecified: Secondary | ICD-10-CM | POA: Diagnosis not present

## 2018-08-31 ENCOUNTER — Other Ambulatory Visit: Payer: Self-pay

## 2018-08-31 ENCOUNTER — Emergency Department (HOSPITAL_COMMUNITY)
Admission: EM | Admit: 2018-08-31 | Discharge: 2018-09-01 | Disposition: A | Discharge status: Home / Self Care | Payer: 59 | Attending: Emergency Medicine | Admitting: Emergency Medicine

## 2018-08-31 ENCOUNTER — Emergency Department (HOSPITAL_COMMUNITY): Payer: 59

## 2018-08-31 ENCOUNTER — Encounter (HOSPITAL_COMMUNITY): Payer: Self-pay | Admitting: Emergency Medicine

## 2018-08-31 DIAGNOSIS — Z885 Allergy status to narcotic agent status: Secondary | ICD-10-CM | POA: Insufficient documentation

## 2018-08-31 DIAGNOSIS — Z91048 Other nonmedicinal substance allergy status: Secondary | ICD-10-CM | POA: Insufficient documentation

## 2018-08-31 DIAGNOSIS — Z79899 Other long term (current) drug therapy: Secondary | ICD-10-CM | POA: Insufficient documentation

## 2018-08-31 DIAGNOSIS — R0789 Other chest pain: Secondary | ICD-10-CM | POA: Insufficient documentation

## 2018-08-31 DIAGNOSIS — R002 Palpitations: Secondary | ICD-10-CM | POA: Insufficient documentation

## 2018-08-31 DIAGNOSIS — Z87898 Personal history of other specified conditions: Secondary | ICD-10-CM

## 2018-08-31 HISTORY — DX: Other disorders of pituitary gland: E23.6

## 2018-08-31 LAB — CBC
HCT: 41.6 % (ref 36.0–46.0)
Hemoglobin: 13.6 g/dL (ref 12.0–15.0)
MCH: 28.6 pg (ref 26.0–34.0)
MCHC: 32.7 g/dL (ref 30.0–36.0)
MCV: 87.4 fL (ref 80.0–100.0)
Platelets: 264 10*3/uL (ref 150–400)
RBC: 4.76 MIL/uL (ref 3.87–5.11)
RDW: 12.9 % (ref 11.5–15.5)
WBC: 6.8 10*3/uL (ref 4.0–10.5)
nRBC: 0 % (ref 0.0–0.2)

## 2018-08-31 LAB — BASIC METABOLIC PANEL
Anion gap: 10 (ref 5–15)
BUN: 16 mg/dL (ref 6–20)
CO2: 25 mmol/L (ref 22–32)
Calcium: 8.8 mg/dL — ABNORMAL LOW (ref 8.9–10.3)
Chloride: 103 mmol/L (ref 98–111)
Creatinine, Ser: 0.74 mg/dL (ref 0.44–1.00)
GFR calc Af Amer: >60 mL/min (ref >60)
GFR calc non Af Amer: >60 mL/min (ref >60)
Glucose, Bld: 128 mg/dL — ABNORMAL HIGH (ref 70–99)
Potassium: 3.6 mmol/L (ref 3.5–5.1)
Sodium: 138 mmol/L (ref 135–145)

## 2018-08-31 LAB — HEPATIC FUNCTION PANEL
ALT: 18 U/L (ref 0–44)
AST: 16 U/L (ref 15–41)
Albumin: 3.9 g/dL (ref 3.5–5.0)
Alkaline Phosphatase: 70 U/L (ref 38–126)
Bilirubin, Direct: <0.1 mg/dL (ref 0.0–0.2)
Total Bilirubin: 0.5 mg/dL (ref 0.3–1.2)
Total Protein: 7.5 g/dL (ref 6.5–8.1)

## 2018-08-31 LAB — TROPONIN I (HIGH SENSITIVITY)
Troponin I (High Sensitivity): <2 ng/L (ref <18)
Troponin I (High Sensitivity): <2 ng/L (ref <18)

## 2018-08-31 MED ORDER — SODIUM CHLORIDE 0.9% FLUSH: 3.0000 mL | Freq: Once | INTRAVENOUS | Status: DC

## 2018-08-31 NOTE — ED Triage Notes (Signed)
Pt c/o fluttering sensation since starting new med. Pt states left arm started hurting today and she also c/o left sided jaw numbness intermittently over last couple of days. Pt states she noticed symptoms got better after not taking bromocriptine for a couple of days. Pt states she started taking med again yesterday.

## 2018-08-31 NOTE — ED Provider Notes (Signed)
[facility] EMERGENCY DEPARTMENT Provider Note   CSN: [id number] Arrival date & time: 08/31/18  1904    History   Chief Complaint Chief Complaint  Patient presents with  . Chest Pain    HPI Pamela Mathis is a 38 y.o. female presenting with an approximate one week history of intermittent heart palpitations, occurring most frequently in the morning hours.  She was started on a new medicine, bromocriptine for treatment of a pituitary adenoma 2 weeks ago and she suspects her sx may be related to this medicine. She is under Dr. Porfirio Oar care for this problem.  She reports holding the medication this weekend and had no symptoms, then took again last night and woke with sx.  Today it was accompanied by a vague tight sensation in her left upper chest with tingling radiating to her left shoulder and her left neck. Her palpitations are currently gone but the tingling remains. She denies sob, dizziness, n/v, diaphoresis.  No family hx of premature cardiac events, no personal risk factors for CVD.  She has had no medicines and has found no alleviators for her sx.  She does note a mild soreness when palpating her chest and stretching the left arm overhead reproduces the discomfort.     The history is provided by the patient.    Past Medical History:  Diagnosis Date  . Cholecystitis   . Gastroesophageal reflux   . Headache(784.0)   . No pertinent past medical history   . Pituitary mass [facility])     Patient Active Problem List   Diagnosis Date Noted  . Plantar fasciitis of right foot 02/14/2015  . Cholecystitis, acute 06/13/2013  . Cholecystitis with cholelithiasis 06/13/2013  . Tick bite 08/10/2012    Past Surgical History:  Procedure Laterality Date  . CESAREAN SECTION  12/19/2011   Procedure: CESAREAN SECTION;  Surgeon: Cyril Mourning, MD;  Location: [facility];  Service: Obstetrics;  Laterality: N/A;  . CHOLECYSTECTOMY N/A 06/16/2013   Procedure: LAPAROSCOPIC CHOLECYSTECTOMY;  Surgeon:  Jamesetta So, MD;  Location: [facility];  Service: General;  Laterality: N/A;  . WISDOM TOOTH EXTRACTION       OB History    Gravida  1   Para  1   Term  1   Preterm  0   AB  0   Living  1     SAB  0   TAB  0   Ectopic  0   Multiple  0   Live Births  1            Home Medications    Prior to Admission medications   Medication Sig Start Date End Date Taking? Authorizing Provider  bromocriptine (PARLODEL) 2.5 MG tablet Take 2.5 mg by mouth daily. 08/03/18  Yes [provider]  calcium carbonate (TUMS - DOSED IN MG ELEMENTAL CALCIUM) 500 MG chewable tablet Chew 2 tablets by mouth as needed. hearburn   Yes [provider]    Family History Family History  Problem Relation Age of Onset  . Other Neg Hx     Social History Social History   Tobacco Use  . Smoking status: Never Smoker  . Smokeless tobacco: Never Used  Substance Use Topics  . Alcohol use: No  . Drug use: No     Allergies   Dilaudid [hydromorphone] and Tape   Review of Systems Review of Systems  Constitutional: Negative for chills, diaphoresis and fever.  HENT: Negative for congestion and sore throat.  Eyes: Negative.   Respiratory: Positive for chest tightness. Negative for shortness of breath, wheezing and stridor.   Cardiovascular: Positive for palpitations. Negative for chest pain.  Gastrointestinal: Negative for abdominal pain, nausea and vomiting.  Genitourinary: Negative.   Musculoskeletal: Negative for arthralgias, joint swelling and neck pain.  Skin: Negative.  Negative for rash and wound.  Neurological: Negative for dizziness, weakness, light-headedness, numbness and headaches.  Psychiatric/Behavioral: Negative.      Physical Exam Updated Vital Signs BP 129/83   Pulse 64   Temp 98 F (36.7 C)   Resp 18   Ht 5\' 6"  (1.676 m)   Wt 110.2 kg   LMP 08/13/2018   SpO2 100%   BMI 39.22 kg/m   Physical Exam Vitals signs and nursing note reviewed.   Constitutional:      Appearance: She is well-developed.  HENT:     Head: Normocephalic and atraumatic.  Eyes:     Conjunctiva/sclera: Conjunctivae normal.  Neck:     Musculoskeletal: Normal range of motion.  Cardiovascular:     Rate and Rhythm: Normal rate and regular rhythm.     Pulses:          Carotid pulses are 2+ on the right side and 2+ on the left side.    Heart sounds: Normal heart sounds.  Pulmonary:     Effort: Pulmonary effort is normal. No tachypnea.     Breath sounds: Normal breath sounds. No stridor. No decreased breath sounds, wheezing or rhonchi.  Chest:    Abdominal:     General: Bowel sounds are normal.     Palpations: Abdomen is soft.     Tenderness: There is no abdominal tenderness.  Musculoskeletal: Normal range of motion.  Skin:    General: Skin is warm and dry.  Neurological:     Mental Status: She is alert.      ED Treatments / Results  Labs (all labs ordered are listed, but only abnormal results are displayed) Labs Reviewed  BASIC METABOLIC PANEL - Abnormal; Notable for the following components:      Result Value   Glucose, Bld 128 (*)    Calcium 8.8 (*)    All other components within normal limits  CBC  TROPONIN I (HIGH SENSITIVITY)  TROPONIN I (HIGH SENSITIVITY)  HEPATIC FUNCTION PANEL    EKG EKG Interpretation  Date/Time:  Monday August 31 2018 19:13:23 EDT Ventricular Rate:  75 PR Interval:  126 QRS Duration: 96 QT Interval:  410 QTC Calculation: 457 R Axis:   167 Text Interpretation:  Normal sinus rhythm Right axis deviation Abnormal ECG Confirmed by Julianne Rice [phone]) on 08/31/2018 10:31:38 PM   Radiology Dg Chest 2 View  Result Date: 08/31/2018 CLINICAL DATA:  Chest pain EXAM: CHEST - 2 VIEW COMPARISON:  06/11/2013 FINDINGS: The heart size and mediastinal contours are within normal limits. Both lungs are clear. The visualized skeletal structures are unremarkable. IMPRESSION: No active cardiopulmonary disease.  Electronically Signed   By: Inez Catalina M.D.   On: 08/31/2018 19:42    Procedures Procedures (including critical care time)  Medications Ordered in ED Medications - No data to display   Initial Impression / Assessment and Plan / ED Course  I have reviewed the triage vital signs and the nursing notes.  Pertinent labs & imaging results that were available during my care of the patient were reviewed by me and considered in my medical decision making (see chart for details).  Labs, imaging and ekg reviewed, including negative delta trops.  No palpation sx while here. Pt takes her bromocriptine at night, endorses sx worse in am and generally resolve by late afternoon. Given no sx experienced when she skipped a dose, I strongly suspect SE of this med.  She was asked to contact Dr. Chalmers Cater to discuss this possibility, but asked to continue taking until directed by Dr Chalmers Cater. Also referred to cardiology for palpitation work up to better characterize, in event not SE of this med, and just coincidental timing.  No evidence for ACS.  No sob, VSS, no hypoxia or tachycardia here, doubt PE.    Final Clinical Impressions(s) / ED Diagnoses   Final diagnoses:  History of palpitations    ED Discharge Orders    None       Landis Martins 09/01/18 1209    Julianne Rice, MD 09/03/18 1505

## 2018-08-31 NOTE — Discharge Instructions (Signed)
Your labs, xray and ekg tonight are stable.  I recommend discussing your symptoms with Dr Chalmers Cater and for recommendation regarding continuing your bromocriptine as it is possible your symptoms are from side effect of this medicine.  You also would benefit from a cardiology visit for a Holter monitor which will help better define the type of palpitations and treatment needed if they persist.  Call the [facility] group listed above for this testing.

## 2018-09-03 ENCOUNTER — Emergency Department (HOSPITAL_COMMUNITY): Payer: 59

## 2018-09-03 ENCOUNTER — Other Ambulatory Visit: Payer: Self-pay

## 2018-09-03 ENCOUNTER — Encounter (HOSPITAL_COMMUNITY): Payer: Self-pay | Admitting: Emergency Medicine

## 2018-09-03 ENCOUNTER — Emergency Department (HOSPITAL_COMMUNITY)
Admission: EM | Admit: 2018-09-03 | Discharge: 2018-09-03 | Disposition: A | Discharge status: Home / Self Care | Payer: 59 | Attending: Emergency Medicine | Admitting: Emergency Medicine

## 2018-09-03 DIAGNOSIS — R531 Weakness: Secondary | ICD-10-CM | POA: Diagnosis not present

## 2018-09-03 DIAGNOSIS — R002 Palpitations: Secondary | ICD-10-CM | POA: Diagnosis not present

## 2018-09-03 DIAGNOSIS — R079 Chest pain, unspecified: Secondary | ICD-10-CM | POA: Insufficient documentation

## 2018-09-03 DIAGNOSIS — G43909 Migraine, unspecified, not intractable, without status migrainosus: Secondary | ICD-10-CM | POA: Diagnosis not present

## 2018-09-03 DIAGNOSIS — Z79899 Other long term (current) drug therapy: Secondary | ICD-10-CM | POA: Diagnosis not present

## 2018-09-03 DIAGNOSIS — R519 Headache, unspecified: Secondary | ICD-10-CM

## 2018-09-03 DIAGNOSIS — R200 Anesthesia of skin: Secondary | ICD-10-CM | POA: Diagnosis not present

## 2018-09-03 DIAGNOSIS — R51 Headache: Secondary | ICD-10-CM | POA: Diagnosis present

## 2018-09-03 LAB — I-STAT CHEM 8, ED
BUN: 17 mg/dL (ref 6–20)
Calcium, Ion: 1.1 mmol/L — ABNORMAL LOW (ref 1.15–1.40)
Chloride: 104 mmol/L (ref 98–111)
Creatinine, Ser: 0.6 mg/dL (ref 0.44–1.00)
Glucose, Bld: 142 mg/dL — ABNORMAL HIGH (ref 70–99)
HCT: 40 % (ref 36.0–46.0)
Hemoglobin: 13.6 g/dL (ref 12.0–15.0)
Potassium: 3.8 mmol/L (ref 3.5–5.1)
Sodium: 137 mmol/L (ref 135–145)
TCO2: 23 mmol/L (ref 22–32)

## 2018-09-03 LAB — I-STAT BETA HCG BLOOD, ED (MC, WL, AP ONLY): I-stat hCG, quantitative: <5 m[IU]/mL (ref <5)

## 2018-09-03 LAB — COMPREHENSIVE METABOLIC PANEL
ALT: 17 U/L (ref 0–44)
AST: 18 U/L (ref 15–41)
Albumin: 4.1 g/dL (ref 3.5–5.0)
Alkaline Phosphatase: 71 U/L (ref 38–126)
Anion gap: 12 (ref 5–15)
BUN: 18 mg/dL (ref 6–20)
CO2: 20 mmol/L — ABNORMAL LOW (ref 22–32)
Calcium: 9 mg/dL (ref 8.9–10.3)
Chloride: 103 mmol/L (ref 98–111)
Creatinine, Ser: 0.66 mg/dL (ref 0.44–1.00)
GFR calc Af Amer: >60 mL/min (ref >60)
GFR calc non Af Amer: >60 mL/min (ref >60)
Glucose, Bld: 145 mg/dL — ABNORMAL HIGH (ref 70–99)
Potassium: 3.7 mmol/L (ref 3.5–5.1)
Sodium: 135 mmol/L (ref 135–145)
Total Bilirubin: 0.8 mg/dL (ref 0.3–1.2)
Total Protein: 7.4 g/dL (ref 6.5–8.1)

## 2018-09-03 LAB — CBC
HCT: 41.1 % (ref 36.0–46.0)
Hemoglobin: 13.4 g/dL (ref 12.0–15.0)
MCH: 28.6 pg (ref 26.0–34.0)
MCHC: 32.6 g/dL (ref 30.0–36.0)
MCV: 87.8 fL (ref 80.0–100.0)
Platelets: 245 10*3/uL (ref 150–400)
RBC: 4.68 MIL/uL (ref 3.87–5.11)
RDW: 12.4 % (ref 11.5–15.5)
WBC: 6.4 10*3/uL (ref 4.0–10.5)
nRBC: 0 % (ref 0.0–0.2)

## 2018-09-03 LAB — DIFFERENTIAL
Abs Immature Granulocytes: 0.04 10*3/uL (ref 0.00–0.07)
Basophils Absolute: 0.1 10*3/uL (ref 0.0–0.1)
Basophils Relative: 1 %
Eosinophils Absolute: 0.1 10*3/uL (ref 0.0–0.5)
Eosinophils Relative: 2 %
Immature Granulocytes: 1 %
Lymphocytes Relative: 25 %
Lymphs Abs: 1.6 10*3/uL (ref 0.7–4.0)
Monocytes Absolute: 0.5 10*3/uL (ref 0.1–1.0)
Monocytes Relative: 7 %
Neutro Abs: 4.1 10*3/uL (ref 1.7–7.7)
Neutrophils Relative %: 64 %

## 2018-09-03 LAB — RAPID URINE DRUG SCREEN, HOSP PERFORMED
Amphetamines: NOT DETECTED
Barbiturates: NOT DETECTED
Benzodiazepines: NOT DETECTED
Cocaine: NOT DETECTED
Opiates: NOT DETECTED
Tetrahydrocannabinol: NOT DETECTED

## 2018-09-03 LAB — URINALYSIS, ROUTINE W REFLEX MICROSCOPIC
Bilirubin Urine: NEGATIVE
Glucose, UA: NEGATIVE mg/dL
Hgb urine dipstick: NEGATIVE
Ketones, ur: NEGATIVE mg/dL
Leukocytes,Ua: NEGATIVE
Nitrite: NEGATIVE
Protein, ur: NEGATIVE mg/dL
Specific Gravity, Urine: 1.004 — ABNORMAL LOW (ref 1.005–1.030)
pH: 6 (ref 5.0–8.0)

## 2018-09-03 LAB — PROTIME-INR
INR: 1 (ref 0.8–1.2)
Prothrombin Time: 13.2 seconds (ref 11.4–15.2)

## 2018-09-03 LAB — CBG MONITORING, ED: Glucose-Capillary: 114 mg/dL — ABNORMAL HIGH (ref 70–99)

## 2018-09-03 LAB — APTT: aPTT: 25 seconds (ref 24–36)

## 2018-09-03 MED ORDER — ACETAMINOPHEN 325 MG PO TABS
650.0000 mg | ORAL_TABLET | Freq: Once | ORAL | Status: AC
  Administered 2018-09-03: 12:00:00 650 mg via ORAL
  Filled 2018-09-03: qty 2

## 2018-09-03 MED ORDER — GADOBUTROL 1 MMOL/ML IV SOLN
10.0000 mL | Freq: Once | INTRAVENOUS | Status: AC | PRN
  Administered 2018-09-03: 10 mL via INTRAVENOUS

## 2018-09-03 NOTE — Progress Notes (Signed)
[facility] Tele[facility]urology Consult Services   Date of Service:   09/03/2018 09:48:36  Impression:     .  Rule Out Acute Ischemic Stroke     .  Right Hemispheric Infarct     .  Transient Ischemic Attack     .  r/o complex migraine     .  r/o anxiety     .    Comments/Sign-Out: Patient with hx of pituitary adenoma previously on bromocriptine presents with headache and left sided weakness, paresthesias that are improving. NIHSS 1 for mild sensory deficit on left cheek. CT head unremarkable. Would obtain MRI brain wwo contrast to evaluate the pituitary and exclude CVA. Headache improved, likely complex migraine. Patient should fu with outpatient neurology/endocrinology for further fu of pituitary. Admit for further evaluation if MRI shows any ischemia or acute abnormalities.  Mechanism of Stroke: Small Vessel Disease  Metrics: Last Known Well: 09/03/2018 07:00:00 [facility] Notification Time: 09/03/2018 09:48:36 Arrival Time: 09/03/2018 09:58:39 Stamp Time: 09/03/2018 09:48:36 Time First Login Attempt: 09/03/2018 09:56:00 Video Start Time: 09/03/2018 09:56:00  Symptoms: headache NIHSS Start Assessment Time: 09/03/2018 09:58:00 Patient is not a candidate for tPA. Patient was not deemed candidate for tPA thrombolytics because of Resolved symptoms (no residual disabling symptoms). Video End Time: 09/03/2018 10:11:00  CT head showed no acute hemorrhage or acute core infarct. CT head was reviewed and results were: stable from MRI 2015 aspects 10  Clinical Presentation is not Suggestive of Large Vessel Occlusive Disease  ED Physician notified of diagnostic impression and management plan on 09/03/2018 10:11:40  Our recommendations are outlined below.  Recommendations:     .  Activate Stroke Protocol Admission/Order Set     .  Stroke/Telemetry Floor     .  Neuro Checks     .  Bedside Swallow Eval     .  DVT Prophylaxis     .  IV Fluids, Normal Saline  .  Head of Bed 30 Degrees     .  Euglycemia and Avoid Hyperthermia (PRN Acetaminophen)  Routine Consultation with [facility] for Follow up Care  Sign Out:     .  Discussed with Emergency Department Provider    ------------------------------------------------------------------------------  History of Present Illness: Patient is a 38 year old Female.  Patient was brought by private transportation with symptoms of headache  Patient with hx of pituitary tumor, hx of cardiac arrhythmia who presents after onset of a headache, left eye pain and associated face numbness and left arm weakness. She has dizziness. She also had chest pain and palpitations. Her headache was severe, nauseated, located retro-obitally, behind the left eye. It lasted an hour, she took ibuprofen and it has subsided. Now some residual left face numbness. The weakness improved, currently denied chest pain, palpitations, no dyspnea, no diplopia, no visual loss. No dysarthria. She has no difficulty ambulating. States she was recently taken off bromocriptine. nonsmoker. No hx of Afib, does not take asa  Last seen normal was within 4.5 hours. There is no history of hemorrhagic complications or intracranial hemorrhage. There is no history of Recent Anticoagulants. There is no history of recent major surgery. There is no history of recent stroke.  Examination: BP(123/81), Pulse(71), 1A: Level of Consciousness - Alert; keenly responsive + 0 1B: Ask Month and Age - Both Questions Right + 0 1C: Blink Eyes & Squeeze Hands - Performs Both Tasks + 0 2: Test Horizontal Extraocular Movements - Normal + 0 3: Test Visual Fields - No Visual Loss +  0 4: Test Facial Palsy (Use Grimace if Obtunded) - Normal symmetry + 0 5A: Test Left Arm Motor Drift - No Drift for 10 Seconds + 0 5B: Test Right Arm Motor Drift - No Drift for 10 Seconds + 0 6A: Test Left Leg Motor Drift - No Drift for 5 Seconds + 0 6B: Test Right Leg Motor Drift -  No Drift for 5 Seconds + 0 7: Test Limb Ataxia (FNF/Heel-Shin) - No Ataxia + 0 8: Test Sensation - Mild-Moderate Loss: Less Sharp/More Dull + 1 9: Test Language/Aphasia - Normal; No aphasia + 0 10: Test Dysarthria - Normal + 0 11: Test Extinction/Inattention - No abnormality + 0  NIHSS Score: 1  Patient/Family was informed the Neurology Consult would happen via TeleHealth consult by way of interactive audio and video telecommunications and consented to receiving care in this manner.  Due to the immediate potential for life-threatening deterioration due to underlying acute neurologic illness, I spent 35 minutes providing critical care. This time includes time for face to face visit via telemedicine, review of medical records, imaging studies and discussion of findings with providers, the patient and/or family.   Dr Jacqulynn Cadet Cerria Randhawa   [facility] [phone]   Case [id number]

## 2018-09-03 NOTE — ED Notes (Signed)
Teleneuro on screen with patient

## 2018-09-03 NOTE — ED Notes (Signed)
Pt to MRI

## 2018-09-03 NOTE — Progress Notes (Signed)
CODE STROKE CT TIMES 0940 CALL TIME 0943 EXAM STARTED 0944 EXAM FINISHED 0944 IMAGES STENT TO SOC 0946 EXAM COMPLETED IN EPIC 0947 RADIOLOGY CALLED

## 2018-09-03 NOTE — ED Notes (Signed)
Pt returned from CT °

## 2018-09-03 NOTE — ED Notes (Addendum)
Neurologist states this is a complex migraine.

## 2018-09-03 NOTE — ED Notes (Signed)
Pt back from MRI 

## 2018-09-03 NOTE — ED Notes (Signed)
Pt not a TPA candidate. Recommends an MRI

## 2018-09-03 NOTE — ED Provider Notes (Signed)
[facility] EMERGENCY DEPARTMENT Provider Note   CSN: [id number] Arrival date & time: 09/03/18  0930  An emergency department physician performed an initial assessment on this suspected stroke patient at 0941.  History   Chief Complaint Chief Complaint  Patient presents with  . Code Stroke    HPI Pamela Mathis is a 38 y.o. female.      Weakness Severity:  Moderate Onset quality:  Gradual Timing:  Constant Progression:  Worsening Chronicity:  New Context: not alcohol use   Relieved by:  Nothing Worsened by:  Nothing Ineffective treatments:  None tried Associated symptoms: extremity numbness   Risk factors: new medications   Risk factors: no anemia and no recent stressors   Pt reports she noticed numbness and weakness to her left arm.  Pt reports she also has weakness in her left arm.Pt reports she was normal until 9a.  Pt reports she developed a headache. Pt reports she was recently seen due to abnormal heart beta and had her bromocriptine stopped. Pt has a history of pituitary tumor.     Past Medical History:  Diagnosis Date  . Cholecystitis   . Gastroesophageal reflux   . Headache(784.0)   . No pertinent past medical history   . Pituitary mass [facility])     Patient Active Problem List   Diagnosis Date Noted  . Plantar fasciitis of right foot 02/14/2015  . Cholecystitis, acute 06/13/2013  . Cholecystitis with cholelithiasis 06/13/2013  . Tick bite 08/10/2012    Past Surgical History:  Procedure Laterality Date  . CESAREAN SECTION  12/19/2011   Procedure: CESAREAN SECTION;  Surgeon: Cyril Mourning, MD;  Location: [place] [facility];  Service: Obstetrics;  Laterality: N/A;  . CHOLECYSTECTOMY N/A 06/16/2013   Procedure: LAPAROSCOPIC CHOLECYSTECTOMY;  Surgeon: Jamesetta So, MD;  Location: [facility];  Service: General;  Laterality: N/A;  . WISDOM TOOTH EXTRACTION       OB History    Gravida  1   Para  1   Term  1   Preterm  0   AB  0   Living  1     SAB  0   TAB  0   Ectopic  0   Multiple  0   Live Births  1            Home Medications    Prior to Admission medications   Medication Sig Start Date End Date Taking? Authorizing Provider  bromocriptine (PARLODEL) 2.5 MG tablet Take 2.5 mg by mouth daily. 08/03/18   [provider]  calcium carbonate (TUMS - DOSED IN MG ELEMENTAL CALCIUM) 500 MG chewable tablet Chew 2 tablets by mouth as needed. hearburn    [provider]    Family History Family History  Problem Relation Age of Onset  . Other Neg Hx     Social History Social History   Tobacco Use  . Smoking status: Never Smoker  . Smokeless tobacco: Never Used  Substance Use Topics  . Alcohol use: No  . Drug use: No     Allergies   Dilaudid [hydromorphone] and Tape   Review of Systems Review of Systems  Neurological: Positive for weakness.  All other systems reviewed and are negative.    Physical Exam Updated Vital Signs BP 100/67   Pulse (!) 58   Temp 98.1 F (36.7 C) (Oral)   Resp 13   Ht 5\' 6"  (1.676 m)   Wt 110 kg   LMP 08/13/2018  SpO2 97%   BMI 39.14 kg/m   Physical Exam Vitals signs and nursing note reviewed.  Constitutional:      Appearance: She is well-developed.  HENT:     Head: Normocephalic.     Right Ear: Tympanic membrane normal.     Left Ear: Tympanic membrane normal.     Nose: Nose normal.     Mouth/Throat:     Mouth: Mucous membranes are moist.  Eyes:     Extraocular Movements: Extraocular movements intact.     Pupils: Pupils are equal, round, and reactive to light.  Neck:     Musculoskeletal: Normal range of motion.  Cardiovascular:     Rate and Rhythm: Normal rate.  Pulmonary:     Effort: Pulmonary effort is normal.  Abdominal:     General: Abdomen is flat. There is no distension.  Musculoskeletal: Normal range of motion.  Skin:    General: Skin is warm.     Capillary Refill: Capillary refill takes less than 2 seconds.  Neurological:     General:  No focal deficit present.     Mental Status: She is alert and oriented to person, place, and time.     Sensory: Sensory deficit present.     Comments: Subjective decrese sensation left arm and face,   Psychiatric:        Mood and Affect: Mood normal.      ED Treatments / Results  Labs (all labs ordered are listed, but only abnormal results are displayed) Labs Reviewed  COMPREHENSIVE METABOLIC PANEL - Abnormal; Notable for the following components:      Result Value   CO2 20 (*)    Glucose, Bld 145 (*)    All other components within normal limits  URINALYSIS, ROUTINE W REFLEX MICROSCOPIC - Abnormal; Notable for the following components:   Color, Urine STRAW (*)    Specific Gravity, Urine 1.004 (*)    All other components within normal limits  CBG MONITORING, ED - Abnormal; Notable for the following components:   Glucose-Capillary 114 (*)    All other components within normal limits  I-STAT CHEM 8, ED - Abnormal; Notable for the following components:   Glucose, Bld 142 (*)    Calcium, Ion 1.10 (*)    All other components within normal limits  PROTIME-INR  APTT  CBC  DIFFERENTIAL  RAPID URINE DRUG SCREEN, HOSP PERFORMED  ETHANOL  I-STAT BETA HCG BLOOD, ED ([facility], [facility], [facility] ONLY)    EKG EKG Interpretation  Date/Time:  Thursday September 03 2018 09:41:53 EDT Ventricular Rate:  72 PR Interval:    QRS Duration: 113 QT Interval:  383 QTC Calculation: 420 R Axis:   47 Text Interpretation:  Sinus rhythm Borderline short PR interval Incomplete right bundle branch block Artifact When compared with ECG of 08/31/2018 No significant change was found Confirmed by Francine Graven [phone]) on 09/03/2018 9:48:10 AM   Radiology Mr Brain W And Wo Contrast  Result Date: 09/03/2018 CLINICAL DATA:  38 year old female with sudden onset headache at 0700 hours, left facial numbness, left extremity weakness. EXAM: MRI HEAD WITHOUT AND WITH CONTRAST TECHNIQUE: Multiplanar, multiecho pulse sequences of the  brain and surrounding structures were obtained without and with intravenous contrast. CONTRAST:  10 milliliters Gadavist COMPARISON:  Head CT without contrast 0944 hours today. Brain MRI 08/21/2013. FINDINGS: Brain: No restricted diffusion to suggest acute infarction. No midline shift, mass effect, evidence of mass lesion, ventriculomegaly, extra-axial collection or acute intracranial hemorrhage. Cervicomedullary junction and pituitary are  within normal limits. Stable cerebral volume. Pearline Cables and white matter signal is within normal limits throughout the brain. No encephalomalacia or chronic blood products. No abnormal enhancement identified; there is a small developmental venous anomaly or capillary telangiectasia in the left corona radiata on series 12, image 47 and series 13, image 15 which is stable and benign (normal variant). No dural thickening. Vascular: Major intracranial vascular flow voids are preserved. The major dural venous sinuses are enhancing and appear to be patent. Skull and upper cervical spine: Negative visible cervical spine. Stable bone marrow signal. Sinuses/Orbits: Stable and negative. Other: Mastoids remain clear. Visible internal auditory structures appear normal. Scalp and face soft tissues appear negative. IMPRESSION: Stable since 2015 and normal MRI appearance of the brain. Electronically Signed   By: Genevie Ann M.D.   On: 09/03/2018 11:56   Ct Head Code Stroke Wo Contrast  Addendum Date: 09/03/2018   ADDENDUM REPORT: 09/03/2018 10:06 ADDENDUM: Study discussed by telephone with Dr. Thurnell Garbe on 09/03/2018 at 1002 hours. Electronically Signed   By: Genevie Ann M.D.   On: 09/03/2018 10:06   Result Date: 09/03/2018 CLINICAL DATA:  Code stroke. 38 year old female sudden onset headache at 0700 hours, left facial numbness, left extremity weakness. EXAM: CT HEAD WITHOUT CONTRAST TECHNIQUE: Contiguous axial images were obtained from the base of the skull through the vertex without intravenous contrast.  COMPARISON:  Brain MRI 08/21/2013. FINDINGS: Brain: Stable cerebral volume. No midline shift, ventriculomegaly, mass effect, evidence of mass lesion, intracranial hemorrhage or evidence of cortically based acute infarction. Gray-white matter differentiation is within normal limits throughout the brain. Vascular: No suspicious intracranial vascular hyperdensity. Skull: Hyperostosis, normal variant. Congenital incomplete ossification of the posterior C1 ring. No acute osseous abnormality identified. Sinuses/Orbits: Visualized paranasal sinuses and mastoids are stable and well pneumatized. Other: Negative orbits and scalp. ASPECTS [facility] Stroke Program Early CT Score) - Ganglionic level infarction (caudate, lentiform nuclei, internal capsule, insula, M1-M3 cortex): 7 - Supraganglionic infarction (M4-M6 cortex): 3 Total score (0-10 with 10 being normal): 10 IMPRESSION: 1. Normal noncontrast CT appearance of the brain, stable from the 2015 MRI. 2. ASPECTS 10. Electronically Signed: By: Genevie Ann M.D. On: 09/03/2018 09:59    Procedures Procedures (including critical care time)  Medications Ordered in ED Medications  gadobutrol (GADAVIST) 1 MMOL/ML injection 10 mL (10 mLs Intravenous Contrast Given 09/03/18 1137)  acetaminophen (TYLENOL) tablet 650 mg (650 mg Oral Given 09/03/18 1219)     Initial Impression / Assessment and Plan / ED Course  I have reviewed the triage vital signs and the nursing notes.  Pertinent labs & imaging results that were available during my care of the patient were reviewed by me and considered in my medical decision making (see chart for details).        MDM  Teleneurologist advised probable complex migraine advised MRI.  Mri no acute abnormality.  I think pt is stable to let go home.    Final Clinical Impressions(s) / ED Diagnoses   Final diagnoses:  Acute nonintractable headache, unspecified headache type  Migraine without status migrainosus, not intractable, unspecified  migraine type    ED Discharge Orders    None    Follow up with your MD for recheck    Fransico Meadow, PA-C 09/03/18 [place], [place], DO 09/04/18 [phone]

## 2018-09-03 NOTE — ED Triage Notes (Signed)
Pt c/o sudden onset of HA behind left eye that began around 0700. Pt states she then began having LT sided facial numbness and states her left arm feels slightly weaker than her RT. Pt also reports palpitations and chest tightness. No slurred speech, extremity weakness, or facial droop noted at this time. EDP notified and is at bedside.

## 2018-12-29 ENCOUNTER — Other Ambulatory Visit: Payer: Self-pay | Admitting: *Deleted

## 2018-12-29 DIAGNOSIS — Z20822 Contact with and (suspected) exposure to covid-19: Secondary | ICD-10-CM

## 2018-12-30 LAB — SPECIMEN STATUS REPORT

## 2018-12-30 LAB — NOVEL CORONAVIRUS, NAA: SARS-CoV-2, NAA: NOT DETECTED

## 2019-01-28 ENCOUNTER — Other Ambulatory Visit: Payer: Self-pay

## 2019-01-28 DIAGNOSIS — Z20822 Contact with and (suspected) exposure to covid-19: Secondary | ICD-10-CM

## 2019-01-31 ENCOUNTER — Telehealth: Payer: Self-pay | Admitting: *Deleted

## 2019-01-31 NOTE — Telephone Encounter (Signed)
Patient called for results ,still pending advised to call back. 

## 2019-02-01 LAB — NOVEL CORONAVIRUS, NAA: SARS-CoV-2, NAA: NOT DETECTED

## 2019-10-17 ENCOUNTER — Encounter (HOSPITAL_COMMUNITY): Payer: Self-pay | Admitting: *Deleted

## 2019-10-17 ENCOUNTER — Emergency Department (HOSPITAL_COMMUNITY): Payer: 59

## 2019-10-17 ENCOUNTER — Other Ambulatory Visit: Payer: Self-pay

## 2019-10-17 DIAGNOSIS — R05 Cough: Secondary | ICD-10-CM | POA: Insufficient documentation

## 2019-10-17 DIAGNOSIS — R0602 Shortness of breath: Secondary | ICD-10-CM | POA: Diagnosis not present

## 2019-10-17 DIAGNOSIS — U071 COVID-19: Secondary | ICD-10-CM | POA: Diagnosis not present

## 2019-10-17 DIAGNOSIS — J1282 Pneumonia due to coronavirus disease 2019: Secondary | ICD-10-CM | POA: Diagnosis not present

## 2019-10-17 DIAGNOSIS — R509 Fever, unspecified: Secondary | ICD-10-CM | POA: Diagnosis present

## 2019-10-17 LAB — CBC WITH DIFFERENTIAL/PLATELET
Abs Immature Granulocytes: 0.14 10*3/uL — ABNORMAL HIGH (ref 0.00–0.07)
Basophils Absolute: 0 10*3/uL (ref 0.0–0.1)
Basophils Relative: 1 %
Eosinophils Absolute: 0.1 10*3/uL (ref 0.0–0.5)
Eosinophils Relative: 2 %
HCT: 42.5 % (ref 36.0–46.0)
Hemoglobin: 14 g/dL (ref 12.0–15.0)
Immature Granulocytes: 4 %
Lymphocytes Relative: 26 %
Lymphs Abs: 0.9 10*3/uL (ref 0.7–4.0)
MCH: 29.4 pg (ref 26.0–34.0)
MCHC: 32.9 g/dL (ref 30.0–36.0)
MCV: 89.3 fL (ref 80.0–100.0)
Monocytes Absolute: 0.2 10*3/uL (ref 0.1–1.0)
Monocytes Relative: 4 %
Neutro Abs: 2.1 10*3/uL (ref 1.7–7.7)
Neutrophils Relative %: 63 %
Platelets: 146 10*3/uL — ABNORMAL LOW (ref 150–400)
RBC: 4.76 MIL/uL (ref 3.87–5.11)
RDW: 12.7 % (ref 11.5–15.5)
WBC: 3.4 10*3/uL — ABNORMAL LOW (ref 4.0–10.5)
nRBC: 0 % (ref 0.0–0.2)

## 2019-10-17 LAB — BASIC METABOLIC PANEL
Anion gap: 11 (ref 5–15)
BUN: 15 mg/dL (ref 6–20)
CO2: 25 mmol/L (ref 22–32)
Calcium: 8.6 mg/dL — ABNORMAL LOW (ref 8.9–10.3)
Chloride: 103 mmol/L (ref 98–111)
Creatinine, Ser: 0.65 mg/dL (ref 0.44–1.00)
GFR calc Af Amer: >60 mL/min (ref >60)
GFR calc non Af Amer: >60 mL/min (ref >60)
Glucose, Bld: 114 mg/dL — ABNORMAL HIGH (ref 70–99)
Potassium: 4.3 mmol/L (ref 3.5–5.1)
Sodium: 139 mmol/L (ref 135–145)

## 2019-10-17 MED ORDER — ONDANSETRON HCL 4 MG PO TABS
4.0000 mg | ORAL_TABLET | Freq: Once | ORAL | Status: AC
  Administered 2019-10-17: 4 mg via ORAL
  Filled 2019-10-17: qty 1

## 2019-10-17 MED ORDER — ACETAMINOPHEN 325 MG PO TABS
650.0000 mg | ORAL_TABLET | Freq: Once | ORAL | Status: AC
  Administered 2019-10-17: 650 mg via ORAL
  Filled 2019-10-17: qty 2

## 2019-10-17 NOTE — ED Triage Notes (Signed)
Pt states that she was diagnosed with covid a week ago, c/o nausea, diarrhea, cough, fever, sob, dizziness, body aches.

## 2019-10-18 ENCOUNTER — Emergency Department (HOSPITAL_COMMUNITY)
Admission: EM | Admit: 2019-10-18 | Discharge: 2019-10-18 | Disposition: A | Discharge status: Home / Self Care | Payer: 59 | Attending: Emergency Medicine | Admitting: Emergency Medicine

## 2019-10-18 DIAGNOSIS — U071 COVID-19: Secondary | ICD-10-CM

## 2019-10-18 DIAGNOSIS — R197 Diarrhea, unspecified: Secondary | ICD-10-CM

## 2019-10-18 MED ORDER — ONDANSETRON 4 MG PO TBDP: 4.0000 mg | ORAL_TABLET | Freq: Three times a day (TID) | ORAL | 0 refills | Status: AC | PRN

## 2019-10-18 NOTE — ED Provider Notes (Signed)
[facility] Provider Note   CSN: [id number] Arrival date & time: 10/17/19  1540     History Chief Complaint  Patient presents with   Fever    Pamela Mathis is a 39 y.o. female.  HPI Patient presents with known Covid infection.  Positive test 7 days ago.  Had symptoms for about a week before that.  Since then has had fevers chills.  Developed nausea and vomiting.  Decreased oral intake.  States she is had more shortness of breath.  At times feels lightheaded.  Continuing to have fevers over the last week.  Patient is worried because she is still feeling bad.  States she thinks she got it at school.  Had not been vaccinated.  During the first week of symptoms reportedly had been given antibiotics and had a negative test for Covid at that time.  Patient had around a 9-hour wait before she was seen by me..   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[facility])     Patient Active Problem List   Diagnosis Date Noted   Plantar fasciitis of right foot 02/14/2015   Cholecystitis, acute 06/13/2013   Cholecystitis with cholelithiasis 06/13/2013   Tick bite 08/10/2012    Past Surgical History:  Procedure Laterality Date   CESAREAN SECTION  12/19/2011   Procedure: CESAREAN SECTION;  Surgeon: Cyril Mourning, MD;  Location: [place] [facility];  Service: Obstetrics;  Laterality: N/A;   CHOLECYSTECTOMY N/A 06/16/2013   Procedure: LAPAROSCOPIC CHOLECYSTECTOMY;  Surgeon: Jamesetta So, MD;  Location: [facility];  Service: General;  Laterality: N/A;   WISDOM TOOTH EXTRACTION       OB History    Gravida  1   Para  1   Term  1   Preterm  0   AB  0   Living  1     SAB  0   TAB  0   Ectopic  0   Multiple  0   Live Births  1           Family History  Problem Relation Age of Onset   Other Neg Hx     Social History   Tobacco Use    Smoking status: Never Smoker   Smokeless tobacco: Never Used  Vaping Use   Vaping Use: Never used  Substance Use Topics   Alcohol use: No   Drug use: No    Home Medications Prior to Admission medications   Medication Sig Start Date End Date Taking? Authorizing Provider  bromocriptine (PARLODEL) 2.5 MG tablet Take 2.5 mg by mouth daily. 08/03/18   [provider]  calcium carbonate (TUMS - DOSED IN MG ELEMENTAL CALCIUM) 500 MG chewable tablet Chew 2 tablets by mouth as needed. hearburn    [provider]  fluticasone (FLONASE) 50 MCG/ACT nasal spray Place 1 spray into both nostrils daily.    [provider]  ibuprofen (ADVIL) 400 MG tablet Take 400 mg by mouth every 6 (six) hours as needed for mild pain or moderate pain.    [provider]  ondansetron (ZOFRAN-ODT) 4 MG disintegrating tablet Take 1 tablet (4 mg total) by mouth every 8 (eight) hours as needed for nausea or vomiting. 10/18/19   Davonna Belling, MD    Allergies    Dilaudid [hydromorphone] and Tape  Review of Systems  Review of Systems  Constitutional: Positive for appetite change and fever.  HENT: Negative for congestion.   Respiratory: Positive for cough and shortness of breath.   Cardiovascular: Negative for chest pain.  Gastrointestinal: Positive for diarrhea, nausea and vomiting.  Genitourinary: Negative for flank pain.  Musculoskeletal: Positive for myalgias. Negative for back pain.  Skin: Negative for rash.  Neurological: Positive for light-headedness.  Psychiatric/Behavioral: Negative for confusion.    Physical Exam Updated Vital Signs BP 123/79 (BP Location: Right Arm)    Pulse (!) 105    Temp 99.7 F (37.6 C) (Oral)    Resp 18    Ht 5\' 6"  (1.676 m)    Wt 113.4 kg    SpO2 100%    BMI 40.35 kg/m   Physical Exam Vitals and nursing note reviewed.  HENT:     Head: Normocephalic.  Cardiovascular:     Comments: Mild tachycardia. Pulmonary:     Comments: Mildly  harsh breath sounds without respiratory distress.  Able to complete sentences. Abdominal:     Tenderness: There is no guarding.  Musculoskeletal:        General: No tenderness.     Cervical back: Neck supple.  Skin:    General: Skin is warm.     Capillary Refill: Capillary refill takes less than 2 seconds.  Neurological:     Mental Status: She is alert and oriented to person, place, and time.  Psychiatric:        Mood and Affect: Mood normal.     ED Results / Procedures / Treatments   Labs (all labs ordered are listed, but only abnormal results are displayed) Labs Reviewed  CBC WITH DIFFERENTIAL/PLATELET - Abnormal; Notable for the following components:      Result Value   WBC 3.4 (*)    Platelets 146 (*)    Abs Immature Granulocytes 0.14 (*)    All other components within normal limits  BASIC METABOLIC PANEL - Abnormal; Notable for the following components:   Glucose, Bld 114 (*)    Calcium 8.6 (*)    All other components within normal limits    EKG None  Radiology DG Chest Portable 1 View  Result Date: 10/17/2019 CLINICAL DATA:  COVID positive with shortness of breath. EXAM: PORTABLE CHEST 1 VIEW COMPARISON:  August 31, 2018 FINDINGS: Mild multifocal infiltrates are seen scattered throughout both lungs. There is no evidence of a pleural effusion or pneumothorax. The heart size and mediastinal contours are within normal limits. The visualized skeletal structures are unremarkable. IMPRESSION: Mild multifocal bilateral infiltrates. Electronically Signed   By: Virgina Norfolk M.D.   On: 10/17/2019 19:05    Procedures Procedures (including critical care time)  Medications Ordered in ED Medications  ondansetron (ZOFRAN) tablet 4 mg (4 mg Oral Given 10/17/19 2340)  acetaminophen (TYLENOL) tablet 650 mg (650 mg Oral Given 10/17/19 2341)    ED Course  I have reviewed the triage vital signs and the nursing notes.  Pertinent labs & imaging results that were available during  my care of the patient were reviewed by me and considered in my medical decision making (see chart for details).    MDM Rules/Calculators/A&P                         Patient with Covid infection.  Test positive a week ago but had about a week of symptoms before that.  Not hypoxic.  Has had some nausea vomiting diarrhea.  Tolerating  orals after Zofran.  Feels better now.  Will give prescription for Zofran.  Does not appear to need steroids since not hypoxic.  X-ray does show multifocal pneumonia that is likely viral from the Covid.  Symptoms likely been present for 2 weeks.  Will discharge home. Final Clinical Impression(s) / ED Diagnoses Final diagnoses:  Pneumonia due to COVID-19 virus  Nausea vomiting and diarrhea    Rx / DC Orders ED Discharge Orders         Ordered    ondansetron (ZOFRAN-ODT) 4 MG disintegrating tablet  Every 8 hours PRN        10/18/19 0055           Davonna Belling, MD 10/18/19 0100

## 2021-07-07 IMAGING — CT CT HEAD CODE STROKE W/O CM
3 series · 14 of 47 positions shown, 16 images · non-contrast
Comparison: Brain MRI 08/21/2013.
COMPARISON: Brain MRI 08/21/2013.

Addendum:
CLINICAL DATA: Code stroke. 38-year-old female sudden onset
headache at 8188 hours, left facial numbness, left extremity
weakness.

EXAM:
CT HEAD WITHOUT CONTRAST
TECHNIQUE: Contiguous axial images were obtained from the base of the skull
through the vertex without intravenous contrast.

[Series 2: head w o · axial · 0.47mm/px · z∈[-49,+86]mm · 8 of 33 slices shown, 10 images]
[im 3/33  brain]
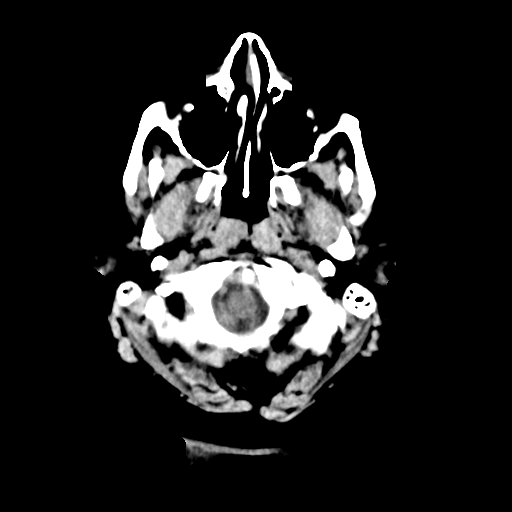
[im 3/33  bone]
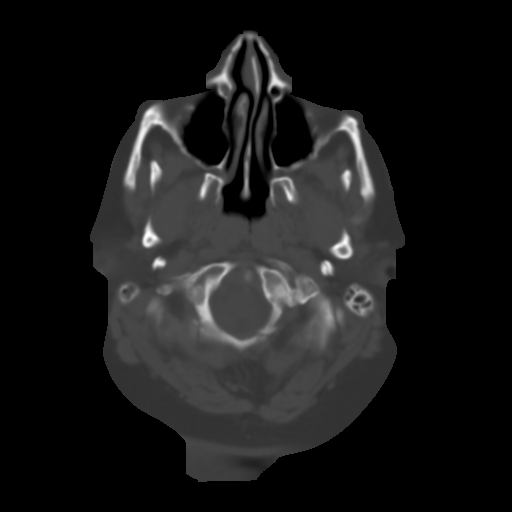
[im 7/33  brain]
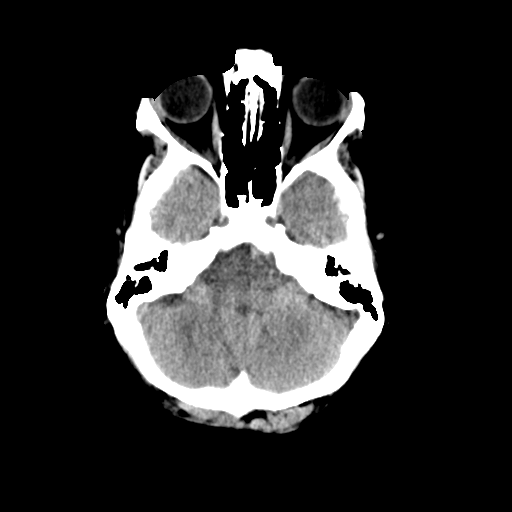
[im 10/33  brain]
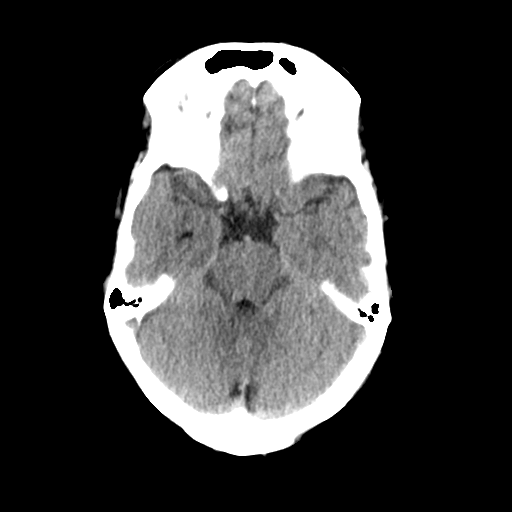
[im 15/33  brain]
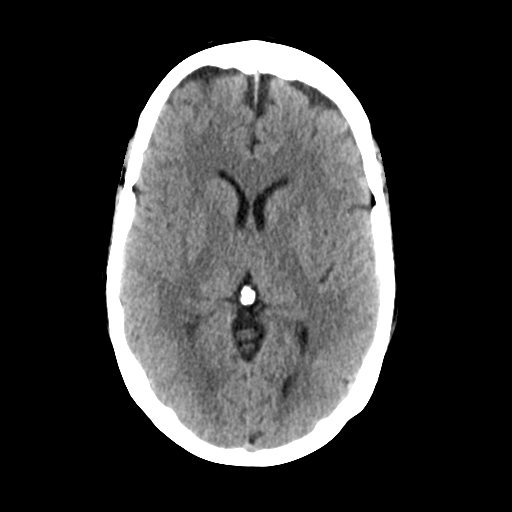
[im 18/33  brain]
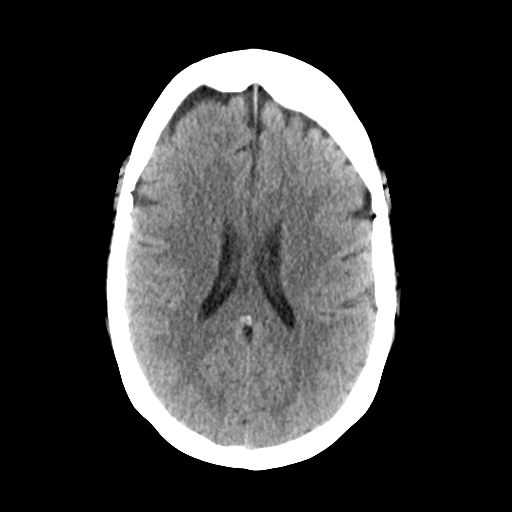
[im 18/33  bone]
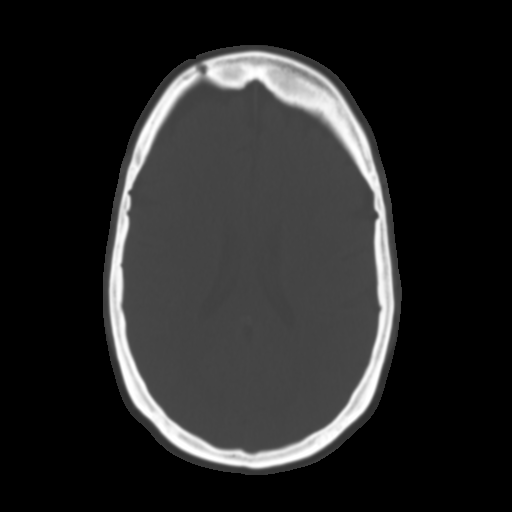
[im 23/33  brain]
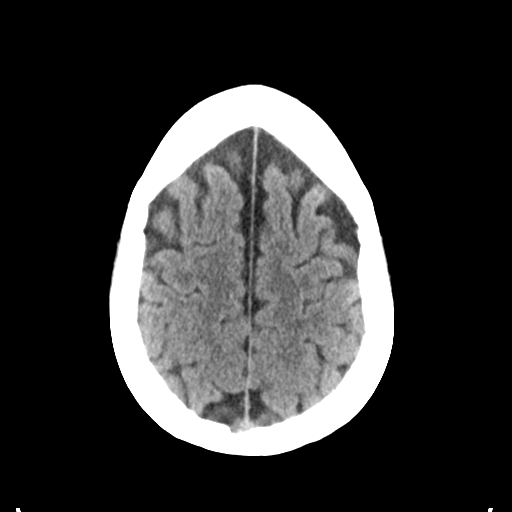
[im 26/33  brain]
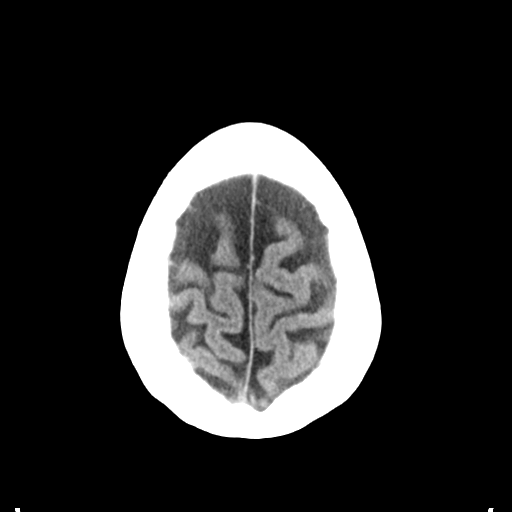
[im 30/33  brain]
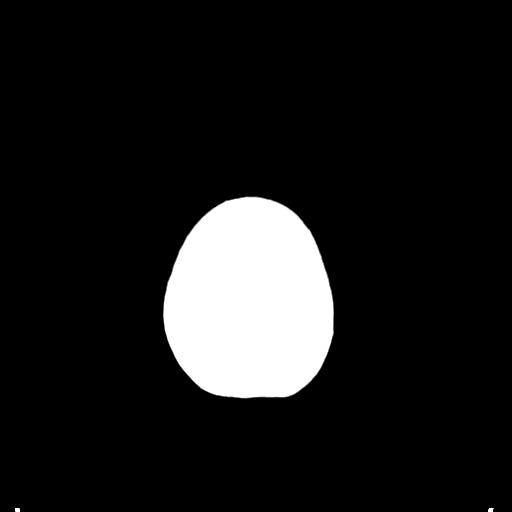

[Series 4: coronal soft · coronal · 0.33mm/px · 3 of 73 slices shown]
[im 25/73  brain]
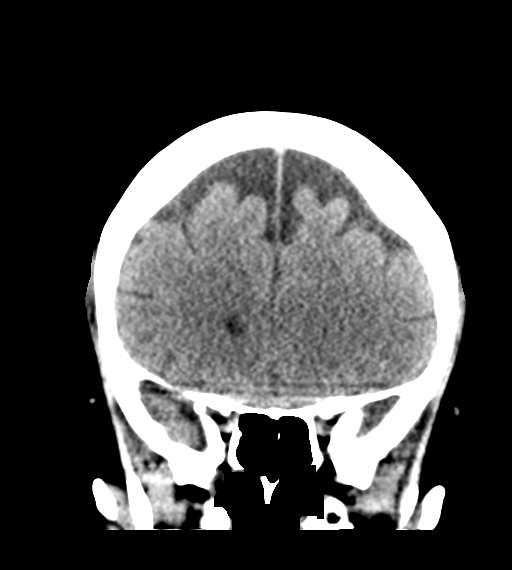
[im 33/73  brain]
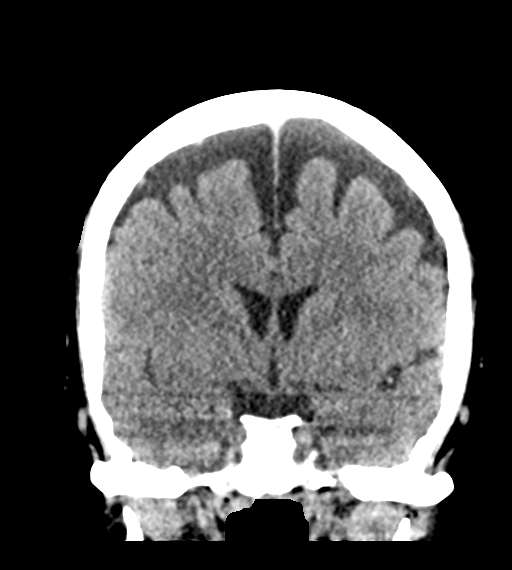
[im 41/73  brain]
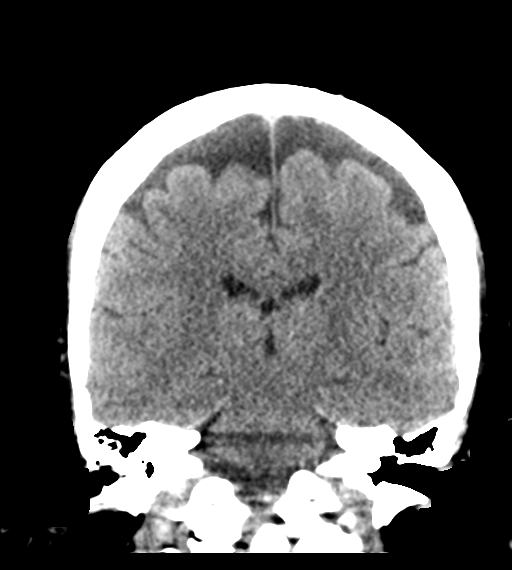

[Series 5: sagittal soft · sagittal · 0.35mm/px · 3 of 63 slices shown]
[im 21/63  brain]
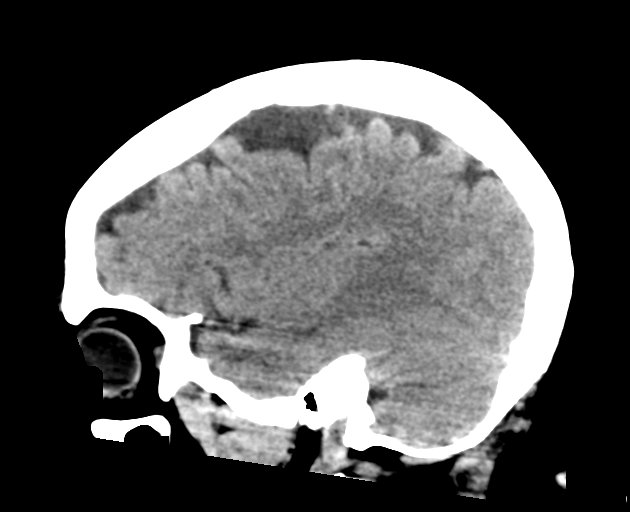
[im 32/63  brain]
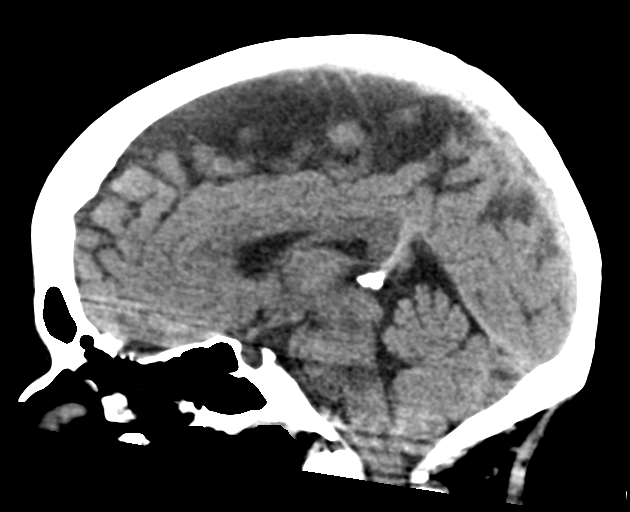
[im 42/63  brain]
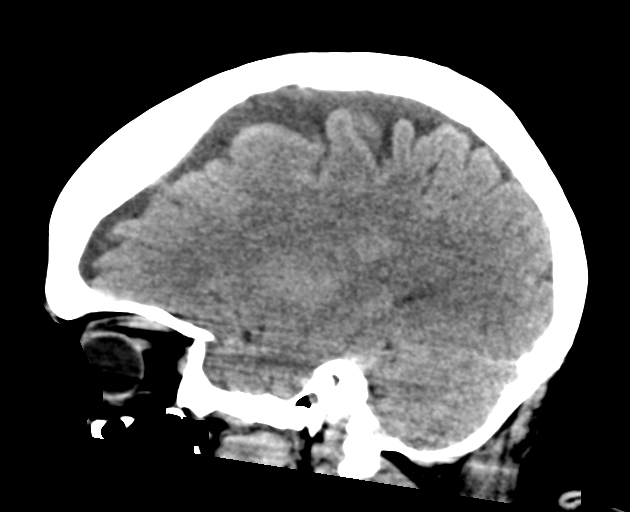

[14 of 47 positions shown; findings below may reference images not displayed]

FINDINGS: Brain: Stable cerebral volume. No midline shift, ventriculomegaly,
mass effect, evidence of mass lesion, intracranial hemorrhage or
evidence of cortically based acute infarction. Gray-white matter
differentiation is within normal limits throughout the brain.

Vascular: No suspicious intracranial vascular hyperdensity.

Skull: Hyperostosis, normal variant. Congenital incomplete
ossification of the posterior C1 ring. No acute osseous abnormality
identified.

Sinuses/Orbits: Visualized paranasal sinuses and mastoids are stable
and well pneumatized.

Other: Negative orbits and scalp.

ASPECTS (Alberta Stroke Program Early CT Score)

- Ganglionic level infarction (caudate, lentiform nuclei, internal
capsule, insula, M1-M3 cortex): 7

- Supraganglionic infarction (M4-M6 cortex): 3

Total score (0-10 with 10 being normal): 10
IMPRESSION: 1. Normal noncontrast CT appearance of the brain, stable from the
6718 MRI.
2. ASPECTS 10.

ADDENDUM:
Study discussed by telephone with Dr. Metenet on 09/03/2018 at 7116
hours.

*** End of Addendum ***
FINDINGS: Brain: Stable cerebral volume. No midline shift, ventriculomegaly,
mass effect, evidence of mass lesion, intracranial hemorrhage or
evidence of cortically based acute infarction. Gray-white matter
differentiation is within normal limits throughout the brain.

Vascular: No suspicious intracranial vascular hyperdensity.

Skull: Hyperostosis, normal variant. Congenital incomplete
ossification of the posterior C1 ring. No acute osseous abnormality
identified.

Sinuses/Orbits: Visualized paranasal sinuses and mastoids are stable
and well pneumatized.

Other: Negative orbits and scalp.

ASPECTS (Alberta Stroke Program Early CT Score)

- Ganglionic level infarction (caudate, lentiform nuclei, internal
capsule, insula, M1-M3 cortex): 7

- Supraganglionic infarction (M4-M6 cortex): 3

Total score (0-10 with 10 being normal): 10
IMPRESSION: 1. Normal noncontrast CT appearance of the brain, stable from the
6718 MRI.
2. ASPECTS 10.

## 2022-08-20 IMAGING — DX DG CHEST 1V PORT
1 series · 1 of 1 positions shown · non-contrast
Comparison: August 31, 2018

CLINICAL DATA: COVID positive with shortness of breath.

EXAM:
PORTABLE CHEST 1 VIEW

[chest ap grid]
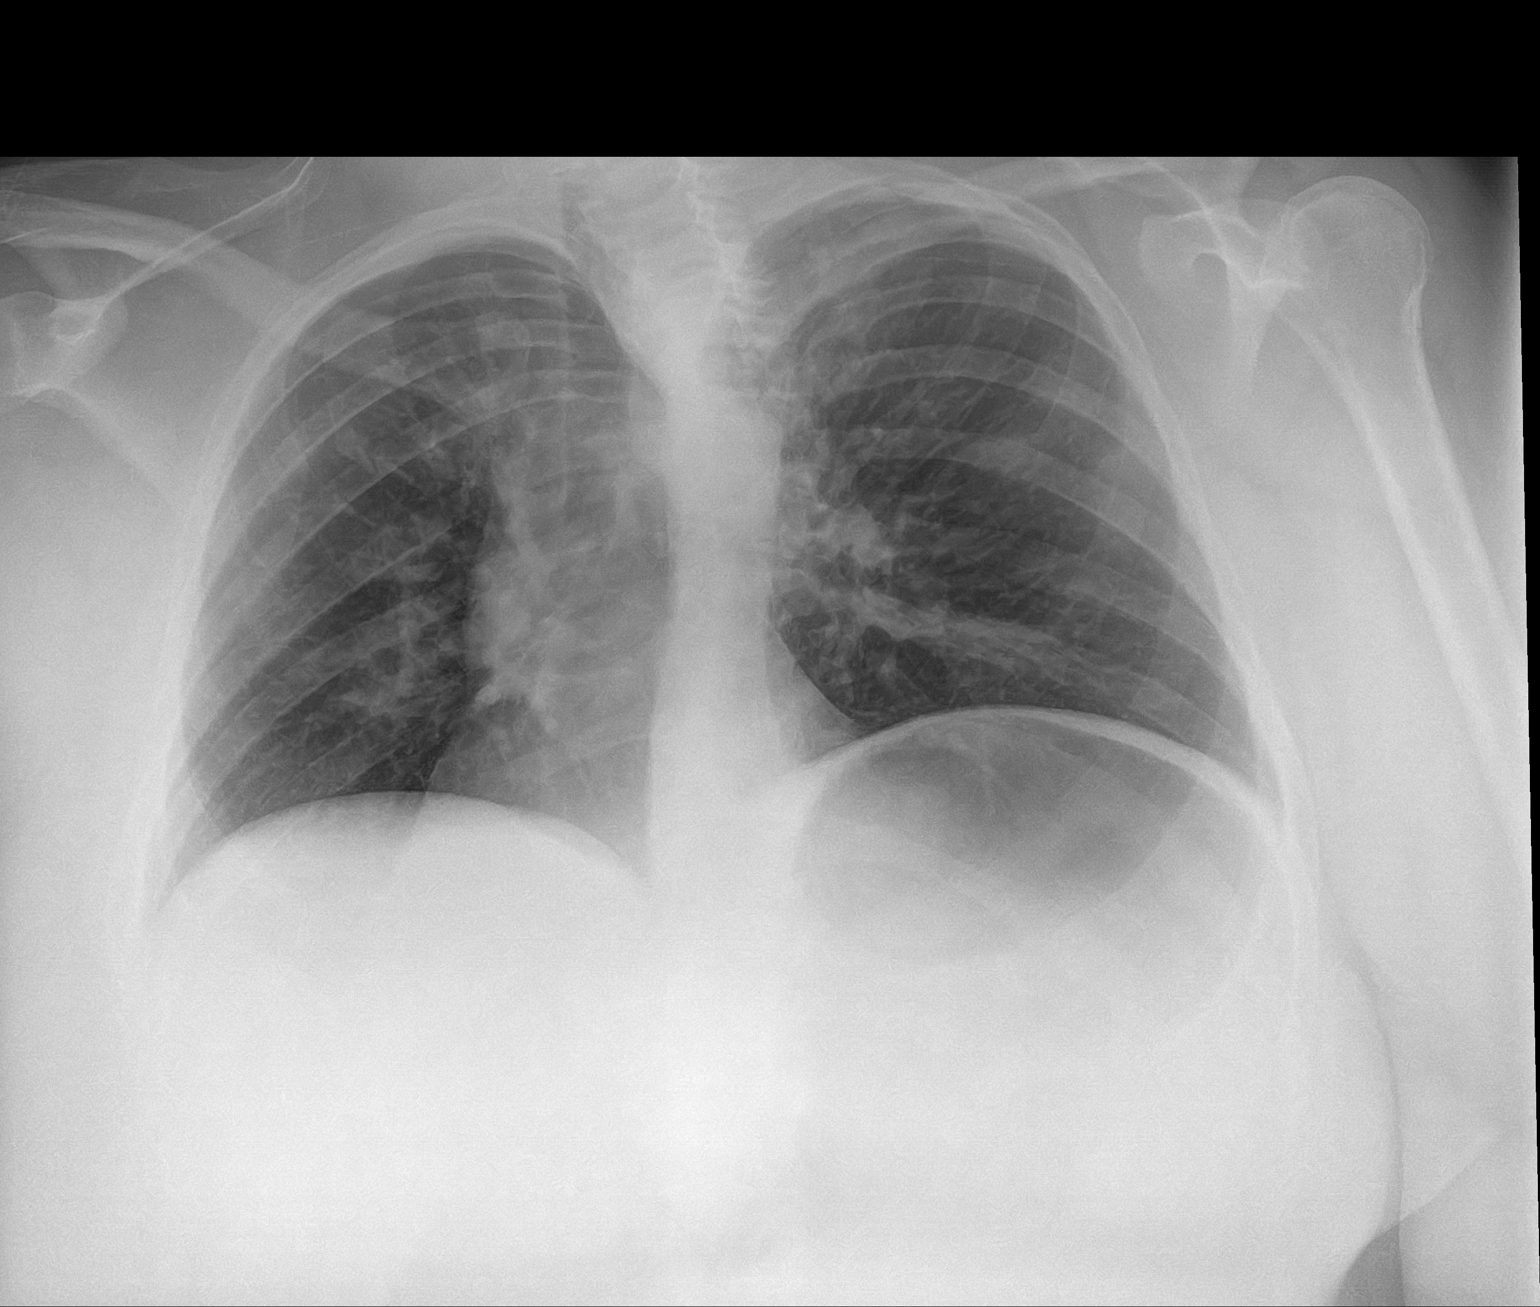

[1 of 1 positions shown; findings below may reference images not displayed]

FINDINGS: Mild multifocal infiltrates are seen scattered throughout both
lungs. There is no evidence of a pleural effusion or pneumothorax.
The heart size and mediastinal contours are within normal limits.
The visualized skeletal structures are unremarkable.
IMPRESSION: Mild multifocal bilateral infiltrates.

## 2022-11-11 ENCOUNTER — Ambulatory Visit
Admission: EM | Admit: 2022-11-11 | Discharge: 2022-11-11 | Disposition: A | Discharge status: Home / Self Care | Payer: 59

## 2022-11-11 DIAGNOSIS — J069 Acute upper respiratory infection, unspecified: Secondary | ICD-10-CM | POA: Insufficient documentation

## 2022-11-11 DIAGNOSIS — Z1152 Encounter for screening for COVID-19: Secondary | ICD-10-CM | POA: Insufficient documentation

## 2022-11-11 NOTE — ED Provider Notes (Signed)
[facility]    CSN: [id number] Arrival date & time: 11/11/22  1219      History   Chief Complaint Chief Complaint  Patient presents with   Cough    HPI Pamela Mathis is a 42 y.o. female.   Patient presenting today with 2-day history of cough, congestion, headache, right ear pain.  Denies fever, chills, body aches, chest pain, shortness of breath, abdominal pain, nausea vomiting or diarrhea.  So far trying Claritin, Astelin nasal spray and Delsym with minimal relief.  Daughter sick with very similar symptoms.    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[facility])     Patient Active Problem List   Diagnosis Date Noted   Plantar fasciitis of right foot 02/14/2015   Cholecystitis, acute 06/13/2013   Cholecystitis with cholelithiasis 06/13/2013   Tick bite 08/10/2012    Past Surgical History:  Procedure Laterality Date   CESAREAN SECTION  12/19/2011   Procedure: CESAREAN SECTION;  Surgeon: Jeani Hawking, MD;  Location: [facility];  Service: Obstetrics;  Laterality: N/A;   CHOLECYSTECTOMY N/A 06/16/2013   Procedure: LAPAROSCOPIC CHOLECYSTECTOMY;  Surgeon: Dalia Heading, MD;  Location: [facility];  Service: General;  Laterality: N/A;   WISDOM TOOTH EXTRACTION      OB History     Gravida  1   Para  1   Term  1   Preterm  0   AB  0   Living  1      SAB  0   IAB  0   Ectopic  0   Multiple  0   Live Births  1            Home Medications    Prior to Admission medications   Medication Sig Start Date End Date Taking? Authorizing Provider  calcium carbonate (TUMS - DOSED IN MG ELEMENTAL CALCIUM) 500 MG chewable tablet Chew 2 tablets by mouth as needed. hearburn   Yes [provider]  cholecalciferol (VITAMIN D3) 25 MCG (1000 UNIT) tablet Take 1,000 Units by mouth daily.   Yes [provider]  bromocriptine  (PARLODEL) 2.5 MG tablet Take 2.5 mg by mouth daily. 08/03/18   [provider]  cabergoline (DOSTINEX) 0.5 MG tablet Take 0.25 mg by mouth 2 (two) times a week.    [provider]  fluticasone (FLONASE) 50 MCG/ACT nasal spray Place 1 spray into both nostrils daily.    [provider]  ibuprofen (ADVIL) 400 MG tablet Take 400 mg by mouth every 6 (six) hours as needed for mild pain or moderate pain.    [provider]  ondansetron (ZOFRAN-ODT) 4 MG disintegrating tablet Take 1 tablet (4 mg total) by mouth every 8 (eight) hours as needed for nausea or vomiting. 10/18/19   Benjiman Core, MD    Family History Family History  Problem Relation Age of Onset   Other Neg Hx     Social History Social History   Tobacco Use   Smoking status: Never   Smokeless tobacco: Never  Vaping Use   Vaping status: Never Used  Substance Use Topics   Alcohol use: No   Drug use: No     Allergies   Dilaudid [hydromorphone] and Tape   Review of Systems Review of Systems Per HPI  Physical Exam Triage Vital Signs ED Triage Vitals  Encounter Vitals Group     BP 11/11/22 1318 119/83     Systolic BP Percentile --      Diastolic BP Percentile --      Pulse Rate 11/11/22 1318 77     Resp 11/11/22 1318 16     Temp 11/11/22 1318 98.2 F (36.8 C)     Temp Source 11/11/22 1318 Oral     SpO2 11/11/22 1318 96 %     Weight --      Height --      Head Circumference --      Peak Flow --      Pain Score 11/11/22 1324 5     Pain Loc --      Pain Education --      Exclude from Growth Chart --    No data found.  Updated Vital Signs BP 119/83 (BP Location: Right Arm)   Pulse 77   Temp 98.2 F (36.8 C) (Oral)   Resp 16   LMP  (LMP Unknown)   SpO2 96%   Visual Acuity Right Eye Distance:   Left Eye Distance:   Bilateral Distance:    Right Eye Near:   Left Eye Near:    Bilateral Near:     Physical Exam Vitals and nursing note reviewed.  Constitutional:       Appearance: Normal appearance.  HENT:     Head: Atraumatic.     Right Ear: External ear normal.     Left Ear: Tympanic membrane and external ear normal.     Ears:     Comments: Mild right middle ear effusion    Nose: Rhinorrhea present.     Mouth/Throat:     Mouth: Mucous membranes are moist.     Pharynx: Posterior oropharyngeal erythema present.  Eyes:     Extraocular Movements: Extraocular movements intact.     Conjunctiva/sclera: Conjunctivae normal.  Cardiovascular:     Rate and Rhythm: Normal rate and regular rhythm.     Heart sounds: Normal heart sounds.  Pulmonary:     Effort: Pulmonary effort is normal.     Breath sounds: Normal breath sounds. No wheezing or rales.  Musculoskeletal:        General: Normal range of motion.     Cervical back: Normal range of motion and neck supple.  Skin:    General: Skin is warm and dry.  Neurological:     Mental Status: She is alert and oriented to person, place, and time.  Psychiatric:        Mood and Affect: Mood normal.        Thought Content: Thought content normal.      UC Treatments / Results  Labs (all labs ordered are listed, but only abnormal results are displayed) Labs Reviewed  SARS CORONAVIRUS 2 (TAT 6-24 HRS)    EKG   Radiology No results found.  Procedures Procedures (including critical care time)  Medications Ordered in UC Medications - No data to display  Initial Impression / Assessment and Plan / UC Course  I have reviewed the triage vital signs and the nursing notes.  Pertinent labs & imaging results that were available during my care of the patient were reviewed by me and considered in my medical decision making (see chart for details).     Vitals and exam overall reassuring today, and suspicious for viral respiratory infection.  COVID testing pending, good candidate for Paxlovid if positive.  In the meantime, treat with nasal sprays,  over-the-counter cold and congestion medications and  supportive home care.  Return for worsening symptoms.  Declines prescription medications today.  Work note given. Final Clinical Impressions(s) / UC Diagnoses   Final diagnoses:  Viral URI   Discharge Instructions   None    ED Prescriptions   None    PDMP not reviewed this encounter.   Particia Nearing, [place] 11/11/22 [phone]

## 2022-11-11 NOTE — ED Triage Notes (Signed)
Cough, congestion, headache x 2 days, right ear pain x1 week. Taking Claritin, nasal spray and delsym.

## 2022-11-12 LAB — SARS CORONAVIRUS 2 (TAT 6-24 HRS): SARS Coronavirus 2: NEGATIVE

## 2023-05-23 ENCOUNTER — Ambulatory Visit (INDEPENDENT_AMBULATORY_CARE_PROVIDER_SITE_OTHER): Payer: 59 | Admitting: Otolaryngology

## 2023-05-23 VITALS — BP 122/87 | HR 90 | Ht 66.0 in | Wt 271.0 lb

## 2023-05-23 DIAGNOSIS — J324 Chronic pansinusitis: Secondary | ICD-10-CM

## 2023-05-23 DIAGNOSIS — J329 Chronic sinusitis, unspecified: Secondary | ICD-10-CM | POA: Diagnosis not present

## 2023-05-23 DIAGNOSIS — J343 Hypertrophy of nasal turbinates: Secondary | ICD-10-CM | POA: Diagnosis not present

## 2023-05-23 DIAGNOSIS — J342 Deviated nasal septum: Secondary | ICD-10-CM | POA: Diagnosis not present

## 2023-05-23 MED ORDER — AMOXICILLIN-POT CLAVULANATE 875-125 MG PO TABS: 1.0000 | ORAL_TABLET | Freq: Two times a day (BID) | ORAL | 0 refills | Status: AC

## 2023-05-23 MED ORDER — PREDNISONE 10 MG (21) PO TBPK: ORAL_TABLET | ORAL | 0 refills | Status: AC

## 2023-05-23 NOTE — Progress Notes (Signed)
 Patient ID: Pamela Mathis, female   DOB: [birth year], 43 y.o.   MRN: [id number]  CC: Chronic sinusitis, chronic nasal congestion  HPI:  Pamela Mathis is a 43 y.o. female who presents today complaining of chronic rhinosinusitis and nasal congestion for the past 5+ years.  She has been experiencing frequent facial pain and pressure, facial swelling, nasal congestion, and ear pain over the past 5 years.  She was treated with multiple courses of antibiotics.  She has had 4 rounds of antibiotics over the past 4 months.  Despite the treatment, she continues to be symptomatic.  She has a history of environmental allergies.  She uses Claritin, Flonase, and Astelin daily.  She has no previous ENT surgery.  She was diagnosed with COVID infection in February, followed by flu infection in March.  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 She has never used smokeless tobacco. She reports that she does not drink alcohol and does not use drugs.  Allergies:  Allergies  Allergen Reactions   Dilaudid [Hydromorphone] Nausea And Vomiting   Tape Rash    Prior to Admission medications   Medication Sig Start Date End Date Taking? Authorizing Provider  amoxicillin-clavulanate (AUGMENTIN) 875-125 MG tablet Take 1 tablet by mouth 2 (two) times daily for 10 days. 05/23/23 06/02/23  Yes Newman Pies, MD  azelastine (ASTELIN) 0.1 % nasal spray Place into both nostrils 2 (two) times daily. Use in each nostril as directed   Yes [provider]  cholecalciferol (VITAMIN D3) 25 MCG (1000 UNIT) tablet Take 1,000 Units by mouth daily.   Yes [provider]  loratadine (CLARITIN REDITABS) 10 MG dissolvable tablet Take 10 mg by mouth daily.   Yes [provider]  predniSONE (STERAPRED UNI-PAK 21 TAB) 10 MG (21) TBPK tablet Per instructions. 05/23/23  Yes Newman Pies, MD  bromocriptine (PARLODEL) 2.5 MG tablet Take 2.5 mg by mouth daily. 08/03/18   [provider]  cabergoline (DOSTINEX) 0.5 MG tablet Take 0.25 mg by mouth 2 (two) times a week. Patient not taking: Reported on 05/23/2023    [provider]  calcium carbonate (TUMS - DOSED IN MG ELEMENTAL CALCIUM) 500 MG chewable tablet Chew 2 tablets by mouth as needed. hearburn Patient not taking: Reported on 05/23/2023    [provider]  fluticasone (FLONASE) 50 MCG/ACT nasal spray Place 1 spray into both nostrils daily. Patient not taking: Reported on 05/23/2023    [provider]  ibuprofen (ADVIL) 400 MG tablet Take 400 mg by mouth every 6 (six) hours as needed for mild pain or moderate pain. Patient not taking: Reported on 05/23/2023    [provider]  ondansetron (ZOFRAN-ODT) 4 MG disintegrating tablet Take 1 tablet (4 mg total) by mouth every 8 (eight) hours  as needed for nausea or vomiting. Patient not taking: Reported on 05/23/2023 10/18/19   Benjiman Core, MD    Blood pressure 122/87, pulse 90, height 5\' 6"  (1.676 m), weight 271 lb (122.9 kg), SpO2 95%. Exam: General: Communicates without difficulty, well nourished, no acute distress. Head: Normocephalic, no evidence injury, no tenderness, facial buttresses intact without stepoff. Face/sinus: No tenderness to palpation and percussion. Facial movement is normal and symmetric. Eyes: PERRL, EOMI. No scleral icterus,  conjunctivae clear. Neuro: CN II exam reveals vision grossly intact.  No nystagmus at any point of gaze. Ears: Auricles well formed without lesions.  Ear canals are intact without mass or lesion.  No erythema or edema is appreciated.  The TMs are intact without fluid. Nose: External evaluation reveals normal support and skin without lesions.  Dorsum is intact.  Anterior rhinoscopy reveals congested mucosa over anterior aspect of inferior turbinates and intact septum.  Oral:  Oral cavity and oropharynx are intact, symmetric, without erythema or edema.  Mucosa is moist without lesions. Neck: Full range of motion without pain.  There is no significant lymphadenopathy.  No masses palpable.  Thyroid bed within normal limits to palpation.  Parotid glands and submandibular glands equal bilaterally without mass.  Trachea is midline. Neuro:  CN 2-12 grossly intact.   Procedure:  Flexible Nasal Endoscopy: Description: Risks, benefits, and alternatives of flexible endoscopy were explained to the patient.  Specific mention was made of the risk of throat numbness with difficulty swallowing, possible bleeding from the nose and mouth, and pain from the procedure.  The patient gave oral consent to proceed.  The flexible scope was inserted into the right nasal cavity.  Endoscopy of the interior nasal cavity, superior, inferior, and middle meatus was performed. The sphenoid-ethmoid recess was examined. Edematous and polypoid mucosa was noted.  Mucopurulent drainage was noted from the right middle meatus.  Nasal septal deviation noted. Nasopharynx was clear.  Turbinates were hypertrophied but without mass.  The procedure was repeated on the contralateral side with similar findings.  No purulent drainage was noted on the left side.  The patient tolerated the procedure well.   Assessment: 1.  Bilateral chronic rhinosinusitis, with acute exacerbation.  Mucopurulent drainage is noted from the right middle meatus. 2.  Diffuse  edematous and polypoid nasal mucosa, with nasal septal deviation and bilateral inferior turbinate hypertrophy.  Plan: 1.  The physical exam and nasal endoscopy findings are reviewed with the patient. 2.  Augmentin 875 mg p.o. twice daily for 10 days. 3.  Prednisone Dosepak for 6 days. 4.  Continue with Flonase nasal spray 2 sprays each nostril daily. 5.  Sinus CT scan after completing her antibiotic treatment. 6.  The patient will return for reevaluation after her sinus CT scan.  Suresh Audi W Pailyn [place] 05/23/2023, 11:37 AM

## 2023-05-25 DIAGNOSIS — J342 Deviated nasal septum: Secondary | ICD-10-CM | POA: Insufficient documentation

## 2023-05-25 DIAGNOSIS — J343 Hypertrophy of nasal turbinates: Secondary | ICD-10-CM | POA: Insufficient documentation

## 2023-05-25 DIAGNOSIS — J324 Chronic pansinusitis: Secondary | ICD-10-CM | POA: Insufficient documentation

## 2023-06-12 ENCOUNTER — Ambulatory Visit (HOSPITAL_COMMUNITY)
Admission: RE | Admit: 2023-06-12 | Discharge: 2023-06-12 | Disposition: A | Discharge status: Home / Self Care | Source: Ambulatory Visit | Attending: Otolaryngology | Admitting: Otolaryngology

## 2023-06-12 DIAGNOSIS — J324 Chronic pansinusitis: Secondary | ICD-10-CM | POA: Insufficient documentation

## 2023-07-01 ENCOUNTER — Ambulatory Visit (INDEPENDENT_AMBULATORY_CARE_PROVIDER_SITE_OTHER): Admitting: Otolaryngology

## 2023-07-01 ENCOUNTER — Encounter (INDEPENDENT_AMBULATORY_CARE_PROVIDER_SITE_OTHER): Payer: Self-pay

## 2023-07-01 VITALS — Ht 66.0 in | Wt 276.0 lb

## 2023-07-01 DIAGNOSIS — J342 Deviated nasal septum: Secondary | ICD-10-CM | POA: Diagnosis not present

## 2023-07-01 DIAGNOSIS — J329 Chronic sinusitis, unspecified: Secondary | ICD-10-CM

## 2023-07-01 DIAGNOSIS — J3489 Other specified disorders of nose and nasal sinuses: Secondary | ICD-10-CM

## 2023-07-01 DIAGNOSIS — J343 Hypertrophy of nasal turbinates: Secondary | ICD-10-CM

## 2023-07-01 DIAGNOSIS — J324 Chronic pansinusitis: Secondary | ICD-10-CM

## 2023-07-02 NOTE — Progress Notes (Signed)
 Patient ID: Pamela Mathis, female   DOB: [birth year], 43 y.o.   MRN: [id number]  Follow-up: Chronic rhinosinusitis, chronic nasal congestion  HPI: The patient is a 43 year old female who returns today for her follow-up evaluation.  The patient was last seen in March 2025.  At that time, she was complaining of frequent recurrent sinus infections and chronic nasal congestion.  She has been having frequent facial pain and pressure, facial swelling, and foul smelling nasal drainage.  Mucopurulent drainage was noted from her right middle meatus.  She was also noted to have nasal septal deviation and bilateral inferior turbinate hypertrophy.  She was treated with Augmentin , prednisone , and Flonase.  She subsequently underwent a sinus CT scan.  The CT showed diffuse paranasal sinus disease, greatest in the right maxillary sinus.  Mucosal thickening of the right maxillary sinus ostium was noted, resulting in occlusion of the right ostiomeatal complex.  The patient returns today reporting subjective improvement in her symptoms after the medical treatment.  She still has occasional nasal congestion.  However, her facial pain and pressure have decreased.  Exam: General: Communicates without difficulty, well nourished, no acute distress. Head: Normocephalic, no evidence injury, no tenderness, facial buttresses intact without stepoff. Face/sinus: No tenderness to palpation and percussion. Facial movement is normal and symmetric. Eyes: PERRL, EOMI. No scleral icterus, conjunctivae clear. Neuro: CN II exam reveals vision grossly intact.  No nystagmus at any point of gaze. Ears: Auricles well formed without lesions.  Ear canals are intact without mass or lesion.  No erythema or edema is appreciated.  The TMs are intact without fluid. Nose: External evaluation reveals normal support and skin without lesions.  Dorsum is intact.  Anterior rhinoscopy reveals congested mucosa over anterior aspect of inferior turbinates and intact  septum.  No purulence noted. Oral:  Oral cavity and oropharynx are intact, symmetric, without erythema or edema.  Mucosa is moist without lesions. Neck: Full range of motion without pain.  There is no significant lymphadenopathy.  No masses palpable.  Thyroid  bed within normal limits to palpation.  Parotid glands and submandibular glands equal bilaterally without mass.  Trachea is midline. Neuro:  CN 2-12 grossly intact.   Assessment:  1.  Bilateral chronic rhinosinusitis, worse on the right maxillary sinus. 2.  The patient has clinically improved after she was treated with Augmentin , prednisone , and Flonase.  No mucopurulent drainage is noted today. 3.  Chronic nasal obstruction, secondary to nasal septal deviation and bilateral inferior turbinate hypertrophy.  Plan: 1.  The physical exam findings and the CT images are extensively reviewed with the patient. 2.  Continue with Flonase nasal spray 2 sprays each nostril daily. 3.  Allergy evaluation and treatment.  A referral will be arranged soon as possible. 4.  The surgical options of endoscopic sinus surgery, septoplasty, and turbinate reduction are also discussed.  The risk, benefits, and details of the treatment options are reviewed. 5.  The patient will need to consider her options.  She will return for reevaluation in 6 months, sooner if needed.

## 2023-08-18 ENCOUNTER — Ambulatory Visit (INDEPENDENT_AMBULATORY_CARE_PROVIDER_SITE_OTHER): Admitting: Internal Medicine

## 2023-08-18 ENCOUNTER — Encounter: Payer: Self-pay | Admitting: Internal Medicine

## 2023-08-18 ENCOUNTER — Other Ambulatory Visit: Payer: Self-pay

## 2023-08-18 VITALS — BP 118/74 | HR 78 | Temp 98.0°F | Ht 65.0 in | Wt 275.4 lb

## 2023-08-18 DIAGNOSIS — J310 Chronic rhinitis: Secondary | ICD-10-CM | POA: Diagnosis not present

## 2023-08-18 DIAGNOSIS — J329 Chronic sinusitis, unspecified: Secondary | ICD-10-CM | POA: Diagnosis not present

## 2023-08-18 MED ORDER — RYALTRIS 665-25 MCG/ACT NA SUSP: 2.0000 | Freq: Two times a day (BID) | NASAL | 5 refills | Status: AC | PRN

## 2023-08-18 NOTE — Patient Instructions (Addendum)
 Chronic sinusitis Chronic sinusitis with frequent episodes managed with Astepro nasal spray. Possible allergy contribution due to environmental exposure. Discussed allergy shots to reduce episodes. Not interested in sinus surgery (has hypertrophic turbinates and nasal septal deviation). - skin testing for environmental allergies on July 14th at  - Conduct immune workup to assess antibody production and protection. - Prescribe Ryaltris nasal spray if covered- 2 sprays twice daily as needed; will replace Astelin. - consider trial of Allegra 180 mg daily to see if tolerates. - Discuss potential for allergy shots if allergies are confirmed to reduce sinusitis episodes.  Mosquito/insect bites Avoidance measures (DEET repellant for mosquitos, long clothing, etc) If bite occurs with raised rash:  - For itch: Topical steroid (hydrocortisone cream) twice daily as needed + oral antihistamine (zyrtec) - For pain and swelling: Oral anti-inflammatory (ibuprofen ), ice affected area        Follow-up Follow-up appointment scheduled to evaluate and manage chronic sinusitis and potential allergies. July 14th at 10 AM (1-55) must stop antihistamines including astepro nasal spray.

## 2023-08-18 NOTE — Progress Notes (Signed)
 NEW PATIENT Date of Service/Encounter:   08/18/2023 Referring provider: Karis Clunes, MD Primary care provider: Lari Elspeth BRAVO, MD  Subjective:  Pamela Mathis is a 43 y.o. female with a PMHx of pituitary tumor presenting today for evaluation of chronic sinusitis.  History obtained from: chart review and patient.   Discussed the use of AI scribe software for clinical note transcription with the patient, who gave verbal consent to proceed.  History of Present Illness   Pamela Mathis is a 43 year old female who presents with recurrent sinus infections. She was referred by Dr. Karis for evaluation of her sinus problems.  She experiences frequent sinus infections, occurring four to five times a year, primarily from the end of August to April. These infections are typically treated with antibiotics. Currently, she uses Astepro nasal spray at night, which effectively keeps her sinuses dry and prevents issues.  She has a history of the flu and COVID-19 in February and March of this year, with a subsequent sinus infection following the flu. In 2021, she had pneumonia associated with her first COVID-19 infection, which was treated on an outpatient basis. She has not been hospitalized for infections and does not recall any other significant episodes of pneumonia.  She has a history of a pituitary tumor, which she believes contributes to her low calcium levels. Her vitamin D levels were previously low but have normalized. She is borderline prediabetic with a recent blood sugar level of 5.3.  She has tried various allergy medications, including Claritin and Zyrtec, but experienced side effects such as dizziness and sleepiness. Flonase was ineffective, and she finds Astepro most helpful. She has skin allergies, particularly to poison oak and insect bites, which cause significant localized reactions without systemic symptoms.  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 No seasonal allergies affecting her eyes, itchy nose, or itchy ears but reports throat and ear symptoms. No history of asthma or food allergies. No history of medication allergies except for dizziness and vomiting with Dilaudid .   No family history of known immunodeficiencies however her mother did have spinal myelitis when she was a toddler.  She had a cousin who died of melanoma prior to age 61.      Chart Review:  Reviewed ENT notes from referral 07/02/23: frequent sinus infections, nasal congestion. Previous CT confirmed sinus disease. Clinical improvement following Augmentin , prednisone  and flonase. Exam with septal deviation and bilateral inferior turbinate hypertrophy. Plan: flonase, refer to allergy, consider surgery.  06/12/23: Sinus CT findings: Paranasal sinus disease as above, greatest in the right maxillary sinus.  Mucosal thickening of the right maxillary sinus ostium resulting in focal occlusion of the right ostiomeatal unit. Frothy secretions in left sphenoid sinus, recommend clinical correlation for acute sinusitis. Nasal septal deviation.  Past Medical History: 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(HCC)    Medication List:  Current Outpatient Medications  Medication Sig Dispense Refill   cholecalciferol (VITAMIN D3) 25 MCG (1000 UNIT) tablet Take 1,000 Units by mouth daily.     ibuprofen  (ADVIL ) 400 MG tablet Take 400 mg by mouth every 6 (six) hours as needed for mild pain (pain score 1-3) or moderate pain (pain score 4-6).     Olopatadine-Mometasone (RYALTRIS) 665-25 MCG/ACT SUSP Place 2  sprays into the nose 2 (two) times daily as needed. 23 g 5   azelastine (ASTELIN) 0.1 % nasal spray Place into both nostrils 2 (two) times daily. Use in each nostril as directed (Patient not taking: Reported on 08/18/2023)      bromocriptine (PARLODEL) 2.5 MG tablet Take 2.5 mg by mouth daily.     cabergoline (DOSTINEX) 0.5 MG tablet Take 0.25 mg by mouth 2 (two) times a week. (Patient not taking: Reported on 08/18/2023)     calcium carbonate (TUMS - DOSED IN MG ELEMENTAL CALCIUM) 500 MG chewable tablet Chew 2 tablets by mouth as needed. hearburn (Patient not taking: Reported on 08/18/2023)     fluticasone (FLONASE) 50 MCG/ACT nasal spray Place 1 spray into both nostrils daily. (Patient not taking: Reported on 08/18/2023)     loratadine (CLARITIN REDITABS) 10 MG dissolvable tablet Take 10 mg by mouth daily. (Patient not taking: Reported on 08/18/2023)     ondansetron  (ZOFRAN -ODT) 4 MG disintegrating tablet Take 1 tablet (4 mg total) by mouth every 8 (eight) hours as needed for nausea or vomiting. (Patient not taking: Reported on 08/18/2023) 8 tablet 0   predniSONE  (STERAPRED UNI-PAK 21 TAB) 10 MG (21) TBPK tablet Per instructions. 21 tablet 0   No current facility-administered medications for this visit.   Known Allergies:  Allergies  Allergen Reactions   Dilaudid  [Hydromorphone ] Nausea And Vomiting   Tape Rash   Past Surgical History: Past Surgical History:  Procedure Laterality Date   CESAREAN SECTION  12/19/2011   Procedure: CESAREAN SECTION;  Surgeon: Rosaline LITTIE Cobble, MD;  Location: [facility];  Service: Obstetrics;  Laterality: N/A;   CHOLECYSTECTOMY N/A 06/16/2013   Procedure: LAPAROSCOPIC CHOLECYSTECTOMY;  Surgeon: Oneil DELENA Budge, MD;  Location: [facility];  Service: General;  Laterality: N/A;   WISDOM TOOTH EXTRACTION     Family History: Family History  Problem Relation Age of Onset   Asthma Mother    Eczema Father    Allergic rhinitis Sister    Other Neg Hx    Social History: Pamela Mathis lives in a house built 6 years ago, clipping the bedroom, Architectural technologist, central AC, indoor cats, no roaches, not using dust mite covers on the bed of the pillows.  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  No filter  in the home.  [place]   ROS:  All other systems negative except as noted per HPI.  Objective:  Blood pressure 118/74, pulse 78, temperature 98 F (36.7 C), temperature source Temporal, height 5' 5 (1.651 m), weight 275 lb 6.4 oz (124.9 kg), SpO2 97%. Body mass index is 45.83 kg/m. Physical Exam:  General Appearance:  Alert, cooperative, no distress, appears stated age  Head:  Normocephalic, without obvious abnormality, atraumatic  Eyes:  Conjunctiva clear, EOM's intact  Ears EACs normal bilaterally and normal TMs bilaterally  Nose: Nares normal, hypertrophic turbinates, normal mucosa, and no visible anterior polyps  Throat: Lips, tongue normal; teeth and gums normal, normal posterior oropharynx  Neck: Supple, symmetrical  Lungs:   clear to auscultation bilaterally, Respirations unlabored, no coughing  Heart:  regular rate and rhythm and no murmur, Appears well perfused  Extremities: No edema  Skin: Skin color, texture, turgor normal and no rashes or lesions on visualized portions of skin  Neurologic: No gross deficits   Diagnostics:  Labs:  Lab Orders         IgG, IgA, IgM  Diphtheria / Tetanus Antibody Panel         Strep pneumoniae 23 Serotypes IgG         CBC with Differential/Platelet       Assessment and Plan  Assessment and Plan    Chronic sinusitis/recurrent infections/chronic rhinitis: Chronic sinusitis with frequent episodes managed with Astepro nasal spray. Possible allergy contribution due to environmental exposure. Discussed allergy shots to reduce episodes. Not interested in sinus surgery (has hypertrophic turbinates and nasal septal deviation). Consider humoral immune defect due to quantity of sinus infections.   - skin testing for environmental allergies on July 14th at  - Conduct immune workup to assess antibody production and protection. - Prescribe Ryaltris nasal spray if covered- 2 sprays twice daily as needed;  will replace Astelin. - consider trial of Allegra 180 mg daily to see if tolerates. - Discuss potential for allergy shots if allergies are confirmed to reduce sinusitis episodes.  Follow-up Follow-up appointment scheduled to evaluate and manage chronic sinusitis and potential allergies. July 14th at 10 AM (1-55) must stop antihistamines including astepro nasal spray.         This note in its entirety was forwarded to the Provider who requested this consultation.  Other: none  Thank you for your kind referral. I appreciate the opportunity to take part in Saia's care. Please do not hesitate to contact me with questions.  Sincerely,  Rocky Endow, MD [facility] 

## 2023-08-21 ENCOUNTER — Ambulatory Visit: Payer: Self-pay | Admitting: Internal Medicine

## 2023-08-21 LAB — STREP PNEUMONIAE 23 SEROTYPES IGG
Pneumo Ab Type 1*: 0.3 ug/mL — ABNORMAL LOW (ref >1.3)
Pneumo Ab Type 12 (12F)*: 0.2 ug/mL — ABNORMAL LOW (ref >1.3)
Pneumo Ab Type 14*: 1.8 ug/mL (ref >1.3)
Pneumo Ab Type 17 (17F)*: 3.1 ug/mL (ref >1.3)
Pneumo Ab Type 19 (19F)*: 4.9 ug/mL (ref >1.3)
Pneumo Ab Type 2*: 0.6 ug/mL — ABNORMAL LOW (ref >1.3)
Pneumo Ab Type 20*: 1.2 ug/mL — ABNORMAL LOW (ref >1.3)
Pneumo Ab Type 22 (22F)*: 3.6 ug/mL (ref >1.3)
Pneumo Ab Type 23 (23F)*: 0.2 ug/mL — ABNORMAL LOW (ref >1.3)
Pneumo Ab Type 26 (6B)*: 0.1 ug/mL — ABNORMAL LOW (ref >1.3)
Pneumo Ab Type 3*: 0.1 ug/mL — ABNORMAL LOW (ref >1.3)
Pneumo Ab Type 34 (10A)*: 0.4 ug/mL — ABNORMAL LOW (ref >1.3)
Pneumo Ab Type 4*: 0.8 ug/mL — ABNORMAL LOW (ref >1.3)
Pneumo Ab Type 43 (11A)*: 2 ug/mL (ref >1.3)
Pneumo Ab Type 5*: 1.5 ug/mL (ref >1.3)
Pneumo Ab Type 51 (7F)*: 0.6 ug/mL — ABNORMAL LOW (ref >1.3)
Pneumo Ab Type 54 (15B)*: 2.2 ug/mL (ref >1.3)
Pneumo Ab Type 56 (18C)*: 1 ug/mL — ABNORMAL LOW (ref >1.3)
Pneumo Ab Type 57 (19A)*: 1.6 ug/mL (ref >1.3)
Pneumo Ab Type 68 (9V)*: 2.2 ug/mL (ref >1.3)
Pneumo Ab Type 70 (33F)*: 1.6 ug/mL (ref >1.3)
Pneumo Ab Type 8*: 0.6 ug/mL — ABNORMAL LOW (ref >1.3)
Pneumo Ab Type 9 (9N)*: 4.2 ug/mL (ref >1.3)

## 2023-08-21 LAB — IGG, IGA, IGM
IgA/Immunoglobulin A, Serum: 141 mg/dL (ref 87–352)
IgG (Immunoglobin G), Serum: 1232 mg/dL (ref 586–1602)
IgM (Immunoglobulin M), Srm: 103 mg/dL (ref 26–217)

## 2023-08-21 LAB — CBC WITH DIFFERENTIAL/PLATELET
Basophils Absolute: 0.1 10*3/uL (ref 0.0–0.2)
Basos: 1 %
EOS (ABSOLUTE): 0.3 10*3/uL (ref 0.0–0.4)
Eos: 4 %
Hematocrit: 41.6 % (ref 34.0–46.6)
Hemoglobin: 13.5 g/dL (ref 11.1–15.9)
Immature Grans (Abs): 0.1 10*3/uL (ref 0.0–0.1)
Immature Granulocytes: 1 %
Lymphocytes Absolute: 2.3 10*3/uL (ref 0.7–3.1)
Lymphs: 30 %
MCH: 28.6 pg (ref 26.6–33.0)
MCHC: 32.5 g/dL (ref 31.5–35.7)
MCV: 88 fL (ref 79–97)
Monocytes Absolute: 0.7 10*3/uL (ref 0.1–0.9)
Monocytes: 10 %
Neutrophils Absolute: 4.3 10*3/uL (ref 1.4–7.0)
Neutrophils: 54 %
Platelets: 280 10*3/uL (ref 150–450)
RBC: 4.72 x10E6/uL (ref 3.77–5.28)
RDW: 13 % (ref 11.7–15.4)
WBC: 7.8 10*3/uL (ref 3.4–10.8)

## 2023-08-21 LAB — DIPHTHERIA / TETANUS ANTIBODY PANEL
Diphtheria Ab: 0.15 [IU]/mL (ref <0.10)
Tetanus Ab, IgG: 1.66 [IU]/mL (ref <0.10)

## 2023-08-21 NOTE — Progress Notes (Signed)
 Please let Pamela Mathis know that her immune evaluation returned and overall is reassuring. Her strep titers are borderline protective. We could consider having her get the Pneumovax 23 vaccine in clinic, and then repeating those titers in 6 weeks.  This would help me determine whether or not her immune system is capable of making antibodies towards vaccines, and it would also provide her with extra protection against strep pneumonia bacteria.  If she is interested in that, please get her set up for nursing visit for Pneumovax vaccine.

## 2023-09-08 ENCOUNTER — Ambulatory Visit: Admitting: Internal Medicine

## 2023-09-08 ENCOUNTER — Encounter: Payer: Self-pay | Admitting: Internal Medicine

## 2023-09-08 DIAGNOSIS — J3089 Other allergic rhinitis: Secondary | ICD-10-CM | POA: Diagnosis not present

## 2023-09-08 DIAGNOSIS — J302 Other seasonal allergic rhinitis: Secondary | ICD-10-CM | POA: Diagnosis not present

## 2023-09-08 NOTE — Patient Instructions (Addendum)
 Chronic sinusitis Chronic sinusitis with frequent episodes managed with Astepro nasal spray. Possible allergy  contribution due to environmental exposure. Discussed allergy  shots to reduce episodes. Not interested in sinus surgery (has hypertrophic turbinates and nasal septal deviation). - skin testing 09/08/23: positive to dust mites and cockroach; Intradermals positive to grass mix; allergen avoidance - immune work-up looked except strep pneumonia titers were borderline; consider Pneumovax 23 vaccine and updated titers 6 weeks later - continue azelastine nasal spray 1-2 sprays up to twice daily as needed - consider trial of Xyzal 5 mg daily to see if tolerates. (Allegra gives headaches, zyrtec makes her drowsy and Claritin makes her dizzy)  Mosquito/insect bites Avoidance measures (DEET repellant for mosquitos, long clothing, etc) If bite occurs with raised rash:  - For itch: Topical steroid (hydrocortisone cream) twice daily as needed + oral antihistamine (zyrtec) - For pain and swelling: Oral anti-inflammatory (ibuprofen ), ice affected area        Follow up : 3 months, sooner if needed It was a pleasure seeing you again in clinic today! Thank you for allowing me to participate in your care.  Rocky Endow, MD [facility]  DUST MITE AVOIDANCE MEASURES:  There are three main measures that need and can be taken to avoid house dust mites:  Reduce accumulation of dust in general -reduce furniture, clothing, carpeting, books, stuffed animals, especially in bedroom  Separate yourself from the dust -use pillow and mattress encasements (can be found at stores such as [employer] or online) -avoid direct exposure to air condition flow -use a HEPA filter device, especially in the bedroom; you can also use a HEPA filter vacuum cleaner -wipe dust with a moist towel instead of a dry towel or broom when cleaning  Decrease mites and/or their secretions -wash  clothing and linen and stuffed animals at highest temperature possible, at least every 2 weeks -stuffed animals can also be placed in a bag and put in a freezer overnight  Despite the above measures, it is impossible to eliminate dust mites or their allergen completely from your home.  With the above measures the burden of mites in your home can be diminished, with the goal of minimizing your allergic symptoms.  Success will be reached only when implementing and using all means together. Control of Cockroach Allergen  Cockroach allergen has been identified as an important cause of acute attacks of asthma, especially in urban settings.  There are fifty-five species of cockroach that exist in the [place] , however only three, the [place], [place] and [place] species produce allergen that can affect patients with Asthma.  Allergens can be obtained from fecal particles, egg casings and secretions from cockroaches.    Remove food sources. Reduce access to water. Seal access and entry points. Spray runways with 0.5-1% Diazinon or Chlorpyrifos Blow boric acid power under stoves and refrigerator. Place bait stations (hydramethylnon) at feeding sites. Reducing Pollen Exposure  The American Academy of Allergy , Asthma and Immunology suggests the following steps to reduce your exposure to pollen during allergy  seasons.    Do not hang sheets or clothing out to dry; pollen may collect on these items. Do not mow lawns or spend time around freshly cut grass; mowing stirs up pollen. Keep windows closed at night.  Keep car windows closed while driving. Minimize morning activities outdoors, a time when pollen counts are usually at their highest. Stay indoors as much as possible when pollen counts or humidity is high and on  windy days when pollen tends to remain in the air longer. Use air conditioning when possible.  Many air conditioners have filters that trap the pollen spores. Use a HEPA room air filter  to remove pollen form the indoor air you breathe.

## 2023-09-08 NOTE — Progress Notes (Signed)
 Date of Service/Encounter:  09/08/23  Allergy  testing appointment   Initial visit on 08/18/23, seen for chronic sinusitis and rhinitis.  Please see that note for additional details.  Today reports for allergy  diagnostic testing:    DIAGNOSTICS:  Skin Testing: Environmental allergy  panel. Adequate positive and negative controls. Results discussed with patient/family.  Airborne Adult Perc - 09/08/23 1024     Time Antigen Placed 1024    Allergen Manufacturer Jestine    Location Back    Number of Test 55    1. Control-Buffer 50% Glycerol Negative    2. Control-Histamine 3+    3. Bahia Negative    4. French Southern Territ[place] Negative    5. Johnson Negative    6. [place]  Blue Negative    7. Meadow Fescue Negative    8. Perennial Rye Negative    9. Timothy Negative    10. Ragweed Mix Negative    11. Cocklebur Negative    12. Plantain,  English Negative    13. Baccharis Negative    14. Dog Fennel Negative    15. Russian Thistle Negative    16. Lamb's Quarters Negative    17. Sheep Sorrell Negative    18. Rough Pigweed Negative    19. Marsh Elder, Rough Negative    20. Mugwort, Common Negative    21. Box, Elder Negative    22. Cedar, red Negative    23. Sweet Gum Negative    24. Pecan Pollen Negative    25. Pine Mix Negative    26. Walnut, Black Pollen Negative    27. Red Mulberry Negative    28. Ash Mix Negative    29. Birch Mix Negative    30. Beech American Negative    31. Cottonwood, [place] Negative    32. Hickory, White Negative    33. Maple Mix Negative    34. Oak, [place] Mix Negative    35. Sycamore Eastern Negative    36. Alternaria Alternata Negative    37. Cladosporium Herbarum Negative    38. Aspergillus Mix Negative    39. Penicillium Mix Negative    40. Bipolaris Sorokiniana (Helminthosporium) Negative    41. Drechslera Spicifera (Curvularia) Negative    42. Mucor Plumbeus Negative    43. Fusarium Moniliforme Negative    44. Aureobasidium Pullulans (pullulara)  Negative    45. Rhizopus Oryzae Negative    46. Botrytis Cinera Negative    47. Epicoccum Nigrum Negative    48. Phoma Betae Negative    49. Dust Mite Mix 4+    50. Cat Hair 10,000 BAU/ml Negative    51.  Dog Epithelia Negative    52. Mixed Feathers Negative    53. Horse Epithelia Negative    54. Cockroach, German 3+    55. Tobacco Leaf Negative          Intradermal - 09/08/23 1111     Time Antigen Placed 1111    Allergen Manufacturer Jestine    Location Arm    Number of Test 14    Control Negative    Bahia Negative    French Southern Territ[place] Negative    Johnson Negative    7 Grass 3+    Ragweed Mix Negative    Weed Mix Negative    Tree Mix Negative    Mold 1 Negative    Mold 2 Negative    Mold 3 Negative    Mold 4 Negative    Cat Negative    Dog Negative  Allergy  testing results were read and interpreted by myself, documented by clinical staff.  Patient provided with copy of allergy  testing along with avoidance measures when indicated.   Rocky Endow, MD  [facility] 

## 2023-11-10 ENCOUNTER — Other Ambulatory Visit: Payer: Self-pay | Admitting: Obstetrics and Gynecology

## 2023-11-10 DIAGNOSIS — R928 Other abnormal and inconclusive findings on diagnostic imaging of breast: Secondary | ICD-10-CM

## 2023-11-19 ENCOUNTER — Ambulatory Visit
Admission: RE | Admit: 2023-11-19 | Discharge: 2023-11-19 | Disposition: A | Discharge status: Home / Self Care | Source: Ambulatory Visit | Attending: Obstetrics and Gynecology | Admitting: Obstetrics and Gynecology

## 2023-11-19 ENCOUNTER — Other Ambulatory Visit: Payer: Self-pay | Admitting: Obstetrics and Gynecology

## 2023-11-19 DIAGNOSIS — R928 Other abnormal and inconclusive findings on diagnostic imaging of breast: Secondary | ICD-10-CM

## 2023-11-19 DIAGNOSIS — N6320 Unspecified lump in the left breast, unspecified quadrant: Secondary | ICD-10-CM

## 2023-11-21 ENCOUNTER — Ambulatory Visit
Admission: RE | Admit: 2023-11-21 | Discharge: 2023-11-21 | Disposition: A | Discharge status: Home / Self Care | Source: Ambulatory Visit | Attending: Obstetrics and Gynecology | Admitting: Obstetrics and Gynecology

## 2023-11-21 DIAGNOSIS — N6320 Unspecified lump in the left breast, unspecified quadrant: Secondary | ICD-10-CM

## 2023-11-21 HISTORY — PX: BREAST BIOPSY: SHX20

## 2023-11-24 LAB — SURGICAL PATHOLOGY

## 2023-12-03 ENCOUNTER — Encounter: Payer: Self-pay | Admitting: "Endocrinology

## 2023-12-03 ENCOUNTER — Ambulatory Visit: Admitting: "Endocrinology

## 2023-12-03 VITALS — BP 118/78 | HR 68 | Ht 66.0 in | Wt 280.0 lb

## 2023-12-03 DIAGNOSIS — E221 Hyperprolactinemia: Secondary | ICD-10-CM | POA: Insufficient documentation

## 2023-12-03 DIAGNOSIS — D352 Benign neoplasm of pituitary gland: Secondary | ICD-10-CM | POA: Diagnosis not present

## 2023-12-03 DIAGNOSIS — Z6841 Body Mass Index (BMI) 40.0 and over, adult: Secondary | ICD-10-CM | POA: Insufficient documentation

## 2023-12-03 DIAGNOSIS — E66813 Obesity, class 3: Secondary | ICD-10-CM | POA: Diagnosis not present

## 2023-12-03 NOTE — Progress Notes (Signed)
 Endocrinology Consult Note                                            12/03/2023, 3:46 PM   Subjective:    Patient ID: Pamela Mathis, female    DOB: 1981-01-15, PCP Burdine, Elspeth BRAVO, MD   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Past Surgical History:  Procedure Laterality Date   BREAST BIOPSY Left 11/21/2023   US  LT BREAST BX W LOC DEV EA ADD LESION IMG BX SPEC US  GUIDE 11/21/2023 [facility]   BREAST BIOPSY Left 11/21/2023   US  LT BREAST BX W LOC DEV 1ST LESION IMG BX SPEC US  GUIDE 11/21/2023 [facility]   CESAREAN SECTION  12/19/2011   Procedure: CESAREAN SECTION;  Surgeon: Rosaline LITTIE Cobble, MD;  Location: [facility];  Service: Obstetrics;  Laterality: N/A;   CHOLECYSTECTOMY N/A 06/16/2013   Procedure: LAPAROSCOPIC CHOLECYSTECTOMY;  Surgeon: Oneil DELENA Budge, MD;  Location: [facility];  Service: General;  Laterality: N/A;   WISDOM TOOTH EXTRACTION     Social History   Socioeconomic History   Marital status: Married    Spouse name: Not on file   Number of children: Not on file   Years of education: Not on file   Highest education level: Not on file  Occupational History   Not on file  Tobacco Use   Smoking status: Never    Passive exposure: Past   Smokeless tobacco: Never  Vaping Use   Vaping status: Never Used  Substance and Sexual Activity   Alcohol use: No   Drug use: No   Sexual activity: Not on file  Other Topics Concern   Not on file  Social History Narrative   Not on file   Social Drivers of Health   Financial Resource Strain: Not on file  Food Insecurity: Not on file  Transportation Needs: Not on file  Physical Activity: Not on file  Stress: Not on file  Social Connections: Not on file   Family History  Problem Relation Age of Onset   Asthma Mother    Eczema Father    Allergic rhinitis Sister    Other Neg Hx     Outpatient Encounter Medications as of 12/03/2023  Medication Sig   azelastine (ASTELIN) 0.1 % nasal spray Place into both nostrils 2 (two) times daily. Use in each nostril as directed   calcium carbonate (TUMS - DOSED IN MG ELEMENTAL CALCIUM) 500 MG chewable tablet Chew 2 tablets by mouth as needed. hearburn   ZINC OXIDE-VITAMIN C PO Take 1 tablet by mouth daily.   cabergoline (DOSTINEX) 0.5 MG tablet Take 0.25 mg by mouth 2 (two) times a week. (Patient not taking: Reported on 08/18/2023)   cholecalciferol (VITAMIN D3) 25 MCG (1000 UNIT) tablet Take 1,000 Units by mouth daily.   fluticasone (FLONASE) 50 MCG/ACT nasal spray Place 1 spray into both nostrils daily. (Patient not taking: Reported on 08/18/2023)   ibuprofen  (ADVIL ) 400 MG tablet Take 400 mg by mouth every 6 (six) hours as needed for mild pain (pain score 1-3) or moderate pain (pain score 4-6).   loratadine (CLARITIN REDITABS) 10 MG dissolvable tablet Take 10 mg by mouth daily. (  Patient not taking: Reported on 08/18/2023)   Olopatadine-Mometasone (RYALTRIS ) 665-25 MCG/ACT SUSP Place 2 sprays into the nose 2 (two) times daily as needed. (Patient not taking: Reported on 12/03/2023)   ondansetron  (ZOFRAN -ODT) 4 MG disintegrating tablet Take 1 tablet (4 mg total) by mouth every 8 (eight) hours as needed for nausea or vomiting. (Patient not taking: Reported on 08/18/2023)   predniSONE  (STERAPRED UNI-PAK 21 TAB) 10 MG (21) TBPK tablet Per instructions. (Patient not taking: Reported on 12/03/2023)   [DISCONTINUED] bromocriptine (PARLODEL) 2.5 MG tablet Take 2.5 mg by mouth daily.   No facility-administered encounter medications on file as of 12/03/2023.   ALLERGIES: Allergies  Allergen Reactions   Dilaudid  [Hydromorphone ] Nausea And Vomiting   Grass Pollen(K-O-R-T-Swt Vern)    Tape Rash    VACCINATION STATUS: There is no immunization history for the selected administration types on file for this patient.  HPI Pamela Mathis is 43 y.o.  female who presents today with a medical history as above. she is being seen in consultation for medical history of hyperprolactinemia and pituitary microadenoma requested by Lari Elspeth BRAVO, MD.  History is obtained directly from the patient and chart review.  She has documented history of pituitary microadenoma at least from July 2012 when she was found to have 9 mm of pituitary adenoma.  She was subsequently treated with cabergoline for several years with progressive decrease in the size of the adenoma to 3 mm in 2014 and controlled prolactin level. She came off of cabergoline to improve her fertility and reportedly never got back on it.  In 2015 MRI showed pituitary mass slowly growing to 5 mm. She denies visual field deficit, however reports progressive weight gain. She also did not have any menstrual cycles for at least a year.  She is not actively looking to keep her fertility at this time. Her medications include Astelin nasal spray, Tums, vitamin D, ibuprofen  as needed. She denies any family history of endocrine dysfunctions. Her goal is to be assessed again for the need to treat hyperprolactinemia and she wishes to achieve some weight loss.  Review of Systems  Constitutional: + Mildly fluctuating body weight, however progressively gaining, no subjective hyperthermia, no subjective hypothermia Eyes: no blurry vision, no xerophthalmia ENT: no sore throat, no nodules palpated in throat, no dysphagia/odynophagia, no hoarseness Cardiovascular: no Chest Pain, no Shortness of Breath, no palpitations, no leg swelling Respiratory: no cough, no shortness of breath Gastrointestinal: no Nausea/Vomiting/Diarhhea Musculoskeletal: no muscle/joint aches Skin: no rashes Neurological: no tremors, no numbness, no tingling, no dizziness Psychiatric: no depression, no anxiety  Objective:       12/03/2023    1:21 PM 08/18/2023    2:15 PM 07/01/2023    2:51 PM  Vitals with BMI  Height 5' 6 5' 5 5'  6  Weight 280 lbs 275 lbs 6 oz 276 lbs  BMI 45.21 45.83 44.57  Systolic 118 118   Diastolic 78 74   Pulse 68 78     BP 118/78   Pulse 68   Ht 5' 6 (1.676 m)   Wt 280 lb (127 kg)   BMI 45.19 kg/m   Wt Readings from Last 3 Encounters:  12/03/23 280 lb (127 kg)  08/18/23 275 lb 6.4 oz (124.9 kg)  07/01/23 276 lb (125.2 kg)    Physical Exam  Constitutional:  Body mass index is 45.19 kg/m.,  not in acute distress, normal state of mind Eyes: PERRLA, EOMI, no exophthalmos ENT: moist mucous membranes, no  gross thyromegaly, no gross cervical lymphadenopathy Cardiovascular: normal precordial activity, Regular Rate and Rhythm, no Murmur/Rubs/Gallops Respiratory:  adequate breathing efforts, no gross chest deformity, Clear to auscultation bilaterally Gastrointestinal: abdomen soft, Non -tender, No distension, Bowel Sounds present, no gross organomegaly Musculoskeletal: no gross deformities, strength intact in all four extremities, no peripheral edema Skin: moist, warm, no rashes Neurological: no tremor with outstretched hands, Deep tendon reflexes normal in bilateral lower extremities.  CMP ( most recent) CMP     Component Value Date/Time   NA 139 10/17/2019 1623   K 4.3 10/17/2019 1623   CL 103 10/17/2019 1623   CO2 25 10/17/2019 1623   GLUCOSE 114 (H) 10/17/2019 1623   BUN 15 10/17/2019 1623   CREATININE 0.65 10/17/2019 1623   CALCIUM 8.6 (L) 10/17/2019 1623   PROT 7.4 09/03/2018 0956   ALBUMIN 4.1 09/03/2018 0956   AST 18 09/03/2018 0956   ALT 17 09/03/2018 0956   ALKPHOS 71 09/03/2018 0956   BILITOT 0.8 09/03/2018 0956   GFRNONAA >60 10/17/2019 1623     6/ 27,  2015 MRI brain IMPRESSION:  1. The small right inferior intra sellar hypo enhancing lesion  compatible with microadenoma has mildly enlarged since 2014; now 5  mm diameter versus 3 mm at that time.  2. Otherwise stable pituitary with no other abnormality identified.  3. No new intracranial abnormality.    July 2020 brain MRI done for separate condition did not comment on pituitary.  Assessment & Plan:   1. Pituitary microadenoma (HCC) (Primary) 2.  Hyperprolactinemia 3.  BMI of 45.19  - Pamela Mathis  is being seen at a kind request of Burdine, Elspeth BRAVO, MD. - I have reviewed her available  records and clinically evaluated the patient. - Based on these reviews, she has history of pituitary microadenoma complicated by hyperprolactinemia which required intervention with cabergoline in the past,  however,  there is not sufficient recent information to proceed with definitive treatment plan.  - she will need a repeat,  more complete assessment of endocrine axis towards confirming the diagnosis.  -Appointment fasting, unstimulated state she will have the following labs: - Cortisol, urine, free; Future - Creatinine, urine, 24 hour; Future - TSH - T4, free - Prolactin - Insulin-like growth factor - Comprehensive metabolic panel with GFR - Lipid panel  She will not need MRI of the pituitary at this time. - She will be considered for treatment intervention based on her lab findings. - This patient also will benefit from weight management, her options will be discussed on her return specially if she does not have hypercortisolemia.   - she is advised to maintain close follow up with Burdine, Steven E, MD for primary care needs.   -Thank you for involving me in the care of this pleasant patient.  Time spent with the patient: 61  minutes spent in  counseling her about pituitary microadenoma, hyperprolactinemia and the rest in obtaining information about her symptoms, reviewing her previous labs/studies (including abstractions from other facilities),  evaluations, and treatments,  and developing a plan to confirm diagnosis and long term treatment based on the latest standards of care/guidelines; and documenting her care.  Pamela Mathis participated in the discussions, expressed understanding,  and voiced agreement with the above plans.  All questions were answered to her satisfaction. she is encouraged to contact clinic should she have any questions or concerns prior to her return visit.  Follow up plan: Return in about 3 weeks (around  12/24/2023), or she can get Jar Today, for 24 Hr Urine Free Cortisol & Cr, Fasting Labs  in AM B4 8.   Ranny Earl, MD [facility] [address] [place], [place] [address] Phone: [phone]  Fax: [phone]     12/03/2023, 3:46 PM  This note was partially dictated with voice recognition software. Similar sounding words can be transcribed inadequately or may not  be corrected upon review.

## 2023-12-09 LAB — LIPID PANEL
Chol/HDL Ratio: 4.2 ratio (ref 0.0–4.4)
Cholesterol, Total: 164 mg/dL (ref 100–199)
HDL: 39 mg/dL — ABNORMAL LOW (ref >39)
LDL Chol Calc (NIH): 94 mg/dL (ref 0–99)
Triglycerides: 180 mg/dL — ABNORMAL HIGH (ref 0–149)
VLDL Cholesterol Cal: 31 mg/dL (ref 5–40)

## 2023-12-09 LAB — COMPREHENSIVE METABOLIC PANEL WITH GFR
ALT: 29 IU/L (ref 0–32)
AST: 16 IU/L (ref 0–40)
Albumin: 3.9 g/dL (ref 3.9–4.9)
Alkaline Phosphatase: 114 IU/L (ref 41–116)
BUN/Creatinine Ratio: 15 (ref 9–23)
BUN: 11 mg/dL (ref 6–24)
Bilirubin Total: 0.5 mg/dL (ref 0.0–1.2)
CO2: 22 mmol/L (ref 20–29)
Calcium: 9.4 mg/dL (ref 8.7–10.2)
Chloride: 101 mmol/L (ref 96–106)
Creatinine, Ser: 0.73 mg/dL (ref 0.57–1.00)
Globulin, Total: 2.7 g/dL (ref 1.5–4.5)
Glucose: 98 mg/dL (ref 70–99)
Potassium: 4.3 mmol/L (ref 3.5–5.2)
Sodium: 138 mmol/L (ref 134–144)
Total Protein: 6.6 g/dL (ref 6.0–8.5)
eGFR: 104 mL/min/1.73 (ref >59)

## 2023-12-09 LAB — PROLACTIN: Prolactin: 242 ng/mL — ABNORMAL HIGH (ref 4.8–33.4)

## 2023-12-09 LAB — T4, FREE: Free T4: 1.19 ng/dL (ref 0.82–1.77)

## 2023-12-09 LAB — INSULIN-LIKE GROWTH FACTOR: Insulin-Like GF-1: 108 ng/mL (ref 74–239)

## 2023-12-09 LAB — CREATININE, URINE, 24 HOUR
Creatinine, 24H Ur: 1381 mg/(24.h) (ref 800–1800)
Creatinine, Urine: 76.7 mg/dL

## 2023-12-09 LAB — TSH: TSH: 2.17 u[IU]/mL (ref 0.450–4.500)

## 2023-12-12 LAB — CORTISOL, URINE, FREE
Cortisol (Ur), Free: 43 ug/(24.h) — ABNORMAL HIGH (ref 6–42)
Cortisol,F,ug/L,U: 24 ug/L

## 2023-12-15 ENCOUNTER — Ambulatory Visit: Payer: Self-pay | Admitting: Internal Medicine

## 2023-12-15 DIAGNOSIS — J309 Allergic rhinitis, unspecified: Secondary | ICD-10-CM

## 2023-12-30 ENCOUNTER — Ambulatory Visit: Admitting: "Endocrinology

## 2023-12-30 VITALS — BP 118/76 | HR 72 | Ht 66.0 in | Wt 281.2 lb

## 2023-12-30 DIAGNOSIS — E221 Hyperprolactinemia: Secondary | ICD-10-CM

## 2023-12-30 DIAGNOSIS — D352 Benign neoplasm of pituitary gland: Secondary | ICD-10-CM | POA: Diagnosis not present

## 2023-12-30 MED ORDER — CABERGOLINE 0.5 MG PO TABS: 0.2500 mg | ORAL_TABLET | ORAL | 1 refills | Status: DC

## 2023-12-30 NOTE — Progress Notes (Signed)
 12/30/2023, 5:08 PM  Endocrinology follow-up note   Subjective:    Patient ID: Pamela Mathis, female    DOB: 11-29-1980, PCP Burdine, Elspeth BRAVO, MD   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Past Surgical History:  Procedure Laterality Date   BREAST BIOPSY Left 11/21/2023   US  LT BREAST BX W LOC DEV EA ADD LESION IMG BX SPEC US  GUIDE 11/21/2023 [facility]   BREAST BIOPSY Left 11/21/2023   US  LT BREAST BX W LOC DEV 1ST LESION IMG BX SPEC US  GUIDE 11/21/2023 [facility]   CESAREAN SECTION  12/19/2011   Procedure: CESAREAN SECTION;  Surgeon: Rosaline LITTIE Cobble, MD;  Location: [facility];  Service: Obstetrics;  Laterality: N/A;   CHOLECYSTECTOMY N/A 06/16/2013   Procedure: LAPAROSCOPIC CHOLECYSTECTOMY;  Surgeon: Oneil DELENA Budge, MD;  Location: [facility];  Service: General;  Laterality: N/A;   WISDOM TOOTH EXTRACTION     Social History   Socioeconomic History   Marital status: Married    Spouse name: Not on file   Number of children: Not on file   Years of education: Not on file   Highest education level: Not on file  Occupational History   Not on file  Tobacco Use   Smoking status: Never    Passive exposure: Past   Smokeless tobacco: Never  Vaping Use   Vaping status: Never Used  Substance and Sexual Activity   Alcohol use: No   Drug use: No   Sexual activity: Not on file  Other Topics Concern   Not on file  Social History Narrative   Not on file   Social Drivers of Health   Financial Resource Strain: Not on file  Food Insecurity: Not on file  Transportation Needs: Not on file  Physical Activity: Not on file  Stress: Not on file  Social Connections: Not on file   Family History  Problem Relation Age of Onset   Asthma Mother    Eczema Father    Allergic rhinitis Sister    Other Neg Hx     Outpatient Encounter Medications as of 12/30/2023  Medication Sig   azelastine (ASTELIN) 0.1 % nasal spray Place into both nostrils 2 (two) times daily. Use in each nostril as directed   [START ON 01/01/2024] cabergoline (DOSTINEX) 0.5 MG tablet Take 0.5 tablets (0.25 mg total) by mouth 2 (two) times a week.   calcium carbonate (TUMS - DOSED IN MG ELEMENTAL CALCIUM) 500 MG chewable tablet Chew 2 tablets by mouth as needed. hearburn   cholecalciferol (VITAMIN D3) 25 MCG (1000 UNIT) tablet Take 1,000 Units by mouth daily.   fluticasone (FLONASE) 50 MCG/ACT nasal spray Place 1 spray into both nostrils daily. (Patient not taking: Reported on 08/18/2023)   ibuprofen  (ADVIL ) 400 MG tablet Take 400 mg by mouth every 6 (six) hours as needed for mild pain (pain score 1-3) or moderate pain (pain score 4-6).   loratadine (CLARITIN REDITABS) 10 MG dissolvable tablet Take 10 mg by mouth daily. (Patient not taking: Reported on 08/18/2023)   Olopatadine-Mometasone (RYALTRIS ) 665-25  MCG/ACT SUSP Place 2 sprays into the nose 2 (two) times daily as needed. (Patient not taking: Reported on 12/03/2023)   ondansetron  (ZOFRAN -ODT) 4 MG disintegrating tablet Take 1 tablet (4 mg total) by mouth every 8 (eight) hours as needed for nausea or vomiting. (Patient not taking: Reported on 08/18/2023)   predniSONE  (STERAPRED UNI-PAK 21 TAB) 10 MG (21) TBPK tablet Per instructions. (Patient not taking: Reported on 12/03/2023)   ZINC OXIDE-VITAMIN C PO Take 1 tablet by mouth daily. (Patient not taking: Reported on 12/30/2023)   [DISCONTINUED] cabergoline (DOSTINEX) 0.5 MG tablet Take 0.25 mg by mouth 2 (two) times a week. (Patient not taking: Reported on 08/18/2023)   No facility-administered encounter medications on file as of 12/30/2023.   ALLERGIES: Allergies  Allergen Reactions   Dilaudid  [Hydromorphone ] Nausea And Vomiting   Grass Pollen(K-O-R-T-Swt Vern)    Tape Rash    VACCINATION STATUS: There is no immunization history  for the selected administration types on file for this patient.  HPI Pamela Mathis is 43 y.o. female who presents today with a medical history as above. she is being seen in follow-up after she was seen in consultation for medical history of hyperprolactinemia and pituitary microadenoma requested by Lari Elspeth BRAVO, MD.   She has documented history of pituitary microadenoma at least from July 2012 when she was found to have 9 mm of pituitary adenoma.  She was subsequently treated with cabergoline for several years with progressive decrease in the size of the adenoma to 3 mm in 2014 and controlled prolactin level. She came off of cabergoline to improve her fertility and reportedly never got back on it.  In 2015 MRI showed pituitary mass slowly growing to 5 mm.  Her most recent labs show elevation of prolactin to 242, normal IGF, normal thyroid  function test.  She previously had normal 24-hour urine free cortisol measurement.  She denies visual field deficit, however reports progressive weight gain. She also did not have any menstrual cycles for at least a year.  She is not actively looking to keep her fertility at this time. Her medications include Astelin nasal spray, Tums, vitamin D, ibuprofen  as needed. She denies any family history of endocrine dysfunctions. Her goal is to be assessed again for the need to treat hyperprolactinemia and she wishes to achieve some weight loss.  Review of Systems  Constitutional: + Mildly fluctuating body weight, however progressively gaining, no subjective hyperthermia, no subjective hypothermia Eyes: no blurry vision, no xerophthalmia   Objective:       12/30/2023    3:52 PM 12/03/2023    1:21 PM 08/18/2023    2:15 PM  Vitals with BMI  Height 5' 6 5' 6 5' 5  Weight 281 lbs 3 oz 280 lbs 275 lbs 6 oz  BMI 45.41 45.21 45.83  Systolic 118 118 881  Diastolic 76 78 74  Pulse 72 68 78    BP 118/76   Pulse 72   Ht 5' 6 (1.676 m)   Wt 281 lb 3.2  oz (127.6 kg)   BMI 45.39 kg/m   Wt Readings from Last 3 Encounters:  12/30/23 281 lb 3.2 oz (127.6 kg)  12/03/23 280 lb (127 kg)  08/18/23 275 lb 6.4 oz (124.9 kg)    Physical Exam  Constitutional:  Body mass index is 45.39 kg/m.,  not in acute distress, normal state of mind Eyes: PERRLA, EOMI, no exophthalmos   CMP ( most recent) CMP     Component Value Date/Time  NA 138 12/08/2023 0858   K 4.3 12/08/2023 0858   CL 101 12/08/2023 0858   CO2 22 12/08/2023 0858   GLUCOSE 98 12/08/2023 0858   GLUCOSE 114 (H) 10/17/2019 1623   BUN 11 12/08/2023 0858   CREATININE 0.73 12/08/2023 0858   CALCIUM 9.4 12/08/2023 0858   PROT 6.6 12/08/2023 0858   ALBUMIN 3.9 12/08/2023 0858   AST 16 12/08/2023 0858   ALT 29 12/08/2023 0858   ALKPHOS 114 12/08/2023 0858   BILITOT 0.5 12/08/2023 0858   EGFR 104 12/08/2023 0858   GFRNONAA >60 10/17/2019 1623     6/ 27,  2015 MRI brain IMPRESSION:  1. The small right inferior intra sellar hypo enhancing lesion  compatible with microadenoma has mildly enlarged since 2014; now 5  mm diameter versus 3 mm at that time.  2. Otherwise stable pituitary with no other abnormality identified.  3. No new intracranial abnormality.   July 2020 brain MRI done for separate condition did not comment on pituitary.   Recent Results (from the past 2160 hours)  Surgical pathology     Status: None   Collection Time: 11/21/23 12:00 AM  Result Value Ref Range   SURGICAL PATHOLOGY      SURGICAL PATHOLOGY [facility] [address] [place], [place] [address] Telephone [phone] or [phone] Fax [phone]  REPORT OF SURGICAL PATHOLOGY   Accession #: [id number] Patient Name: CHAYANNE, FILIPPI Visit # : [id number]  MRN: [id number] Physician: Neila Mix DOB/Age 24-Jul-1980 (Age: 50) Gender: F Collected Date: 11/21/2023 Received Date: 11/21/2023  FINAL DIAGNOSIS       1. Breast, left, needle core biopsy, 3 o'clock  retroareolar :       BENIGN BREAST TISSUE WITH CYSTS AND NECROTIC CYST CONTENTS.      NEGATIVE FOR ATYPIA OR MALIGNANCY.       2. Breast, left, needle core biopsy, 3 o'clock 3cmfn :       BENIGN BREAST TISSUE WITH DENSE PLASMACYTIC AND HISTIOCYTIC INFLAMMATION AND      GIANT CELL REACTION, FEATURES SUGGESTIVE FOR RUPTURED CYST.      NEGATIVE FOR ATYPIA OR MALIGNANCY.       ELECTRONIC SIGNATURE : Pepper Dutton Md, Pathologist, Electronic Signature  MICROSCOPIC DESCRIPTION  CASE COMMENTS STAINS  USED IN DIAGNOSIS: H&E-2 H&E-3 H&E-4 H&E H&E-2 H&E-3 H&E-4 H&E    CLINICAL HISTORY  SPECIMEN(S) OBTAINED 1. Breast, left, needle core biopsy, 3 O'clock Retroareolar 2. Breast, left, needle core biopsy, 3 O'clock 3cmfn  SPECIMEN COMMENTS: 1. TIF: 9:55 AM, CIT < 5 min 2. TIF: 10:10 AM, CIT < 5 min SPECIMEN CLINICAL INFORMATION: 1. R/O fat necrosis vs IMC    Gross Description 1. Received in formalin, time in formalin 9:55 a.m.and CIT less than 5 minutes, are fragments and cores of soft tan yellow to hemorrhagic tissue measuring 0.6 x 0.5 x 0.2 cm in aggregate.The specimen is submitted in toto 2. Received in formalin, time in formalin 10:10 a.m.and CIT less than 5 minutes, are four cores of soft tan yellow tissue 0.6 to 1.2 cm in length and each 0.2 cm in diameter.The specimen is submitted in toto.(GRP:kh 11/21/23)        Report signed out from the following location(s) [place] [address] [place], [place] [address] CLIA #:  [id number]  [facility] [address] [place], [place] [address] CLIA #: [id number]   TSH     Status: None  Collection Time: 12/08/23  8:58 AM  Result Value Ref Range   TSH 2.170 0.450 - 4.500 uIU/mL  T4, free     Status: None   Collection Time: 12/08/23  8:58 AM  Result Value Ref Range   Free T4 1.19 0.82 - 1.77 ng/dL  Prolactin     Status: Abnormal   Collection Time: 12/08/23  8:58 AM  Result  Value Ref Range   Prolactin 242.0 (H) 4.8 - 33.4 ng/mL  Insulin-like growth factor     Status: None   Collection Time: 12/08/23  8:58 AM  Result Value Ref Range   Insulin-Like GF-1 108 74 - 239 ng/mL  Comprehensive metabolic panel with GFR     Status: None   Collection Time: 12/08/23  8:58 AM  Result Value Ref Range   Glucose 98 70 - 99 mg/dL   BUN 11 6 - 24 mg/dL   Creatinine, Ser 9.26 0.57 - 1.00 mg/dL   eGFR 895 >40 fO/fpw/8.26   BUN/Creatinine Ratio 15 9 - 23   Sodium 138 134 - 144 mmol/L   Potassium 4.3 3.5 - 5.2 mmol/L   Chloride 101 96 - 106 mmol/L   CO2 22 20 - 29 mmol/L   Calcium 9.4 8.7 - 10.2 mg/dL   Total Protein 6.6 6.0 - 8.5 g/dL   Albumin 3.9 3.9 - 4.9 g/dL   Globulin, Total 2.7 1.5 - 4.5 g/dL   Bilirubin Total 0.5 0.0 - 1.2 mg/dL   Alkaline Phosphatase 114 41 - 116 IU/L   AST 16 0 - 40 IU/L   ALT 29 0 - 32 IU/L  Lipid panel     Status: Abnormal   Collection Time: 12/08/23  8:58 AM  Result Value Ref Range   Cholesterol, Total 164 100 - 199 mg/dL   Triglycerides 819 (H) 0 - 149 mg/dL   HDL 39 (L) >60 mg/dL   VLDL Cholesterol Cal 31 5 - 40 mg/dL   LDL Chol Calc (NIH) 94 0 - 99 mg/dL   Chol/HDL Ratio 4.2 0.0 - 4.4 ratio    Comment:                                   T. Chol/HDL Ratio                                             Men  Women                               1/2 Avg.Risk  3.4    3.3                                   Avg.Risk  5.0    4.4                                2X Avg.Risk  9.6    7.1                                3X Avg.Risk 23.4   11.0   Creatinine,  urine, 24 hour     Status: None   Collection Time: 12/08/23  9:00 AM  Result Value Ref Range   Creatinine, Urine 76.7 Not Estab. mg/dL   Creatinine, 75Y Ur 8,618 800 - 1,800 mg/24 hr  Cortisol, urine, free     Status: Abnormal   Collection Time: 12/08/23 12:02 PM  Result Value Ref Range   Cortisol,F,ug/L,U 24 Undefined ug/L   Cortisol (Ur), Free 43 (H) 6 - 42 ug/24 hr    Assessment & Plan:    1. Pituitary microadenoma (HCC) (Primary) 2.  Hyperprolactinemia  - I have reviewed her  new and available  records and clinically evaluated the patient. - Based on these reviews, she has significant elevation of prolactin to 242 likely stemming from  pituitary microadenoma documented from prior pituitary imaging most recent one documenting 5 mm pituitary microadenoma.   I approach her for treatment with dopamine agonist.  She accepts reinitiation of cabergoline at 0.25 mg twice a week with plan to measure her prolactin in 3 months. She does have favorable IGF, thyroid  function test, and lipid panel. In light of her marginal 24-hour urine cortisol, she will be considered for repeat 24-hour urine study for free cortisol after her next visit. She will not need MRI of the pituitary at this time.   - she is advised to maintain close follow up with Burdine, Steven E, MD for primary care needs.  I spent  20  minutes in the care of the patient today including review of labs from Thyroid  Function, CMP, and other relevant labs ; imaging/biopsy records (current and previous including abstractions from other facilities); face-to-face time discussing  her lab results and symptoms, medications doses, her options of short and long term treatment based on the latest standards of care / guidelines;   and documenting the encounter.  Pamela Mathis  participated in the discussions, expressed understanding, and voiced agreement with the above plans.  All questions were answered to her satisfaction. she is encouraged to contact clinic should she have any questions or concerns prior to her return visit.  Follow up plan: Return in about 3 months (around 03/31/2024) for Fasting Labs  in AM B4 8.   Ranny Earl, MD [facility] [address] [place], [place] [address] Phone: [phone]  Fax: [phone]     12/30/2023, 5:08 PM  This note was partially  dictated with voice recognition software. Similar sounding words can be transcribed inadequately or may not  be corrected upon review.

## 2024-01-07 ENCOUNTER — Ambulatory Visit (INDEPENDENT_AMBULATORY_CARE_PROVIDER_SITE_OTHER): Admitting: Otolaryngology

## 2024-01-07 ENCOUNTER — Encounter (INDEPENDENT_AMBULATORY_CARE_PROVIDER_SITE_OTHER): Payer: Self-pay | Admitting: Otolaryngology

## 2024-01-07 VITALS — BP 111/77 | HR 78 | Ht 66.0 in | Wt 280.0 lb

## 2024-01-07 DIAGNOSIS — J310 Chronic rhinitis: Secondary | ICD-10-CM | POA: Diagnosis not present

## 2024-01-07 DIAGNOSIS — J342 Deviated nasal septum: Secondary | ICD-10-CM

## 2024-01-07 DIAGNOSIS — J343 Hypertrophy of nasal turbinates: Secondary | ICD-10-CM

## 2024-01-07 DIAGNOSIS — J302 Other seasonal allergic rhinitis: Secondary | ICD-10-CM

## 2024-01-07 NOTE — Progress Notes (Signed)
 Patient ID: Pamela Mathis, female   DOB: 09-18-1980, 43 y.o.   MRN: [id number]  Follow-up: Chronic rhinosinusitis, chronic nasal congestion  HPI: The patient is a 43 year old female who returns today for follow-up evaluation.  The patient was previously noted to have frequent recurrent sinus infections and chronic nasal obstruction.  She was noted to have nasal mucosal congestion, nasal septal deviation, and bilateral inferior turbinate hypertrophy.  She was also treated with Flonase and Astepro nasal sprays.  The patient returns today reporting no significant sinus infection over the past 6 months.  She has occasional nasal congestion.  She was evaluated by an allergist.  She was noted to have multiple environmental allergies.  Immunotherapy was recommended.  However, it was not covered by her insurance.  She did not proceed with the immunotherapy treatment.  Currently she denies any facial pain, fever, or visual change.  Exam: General: Communicates without difficulty, well nourished, no acute distress. Head: Normocephalic, no evidence injury, no tenderness, facial buttresses intact without stepoff. Face/sinus: No tenderness to palpation and percussion. Facial movement is normal and symmetric. Eyes: PERRL, EOMI. No scleral icterus, conjunctivae clear. Neuro: CN II exam reveals vision grossly intact.  No nystagmus at any point of gaze. Ears: Auricles well formed without lesions.  Ear canals are intact without mass or lesion.  No erythema or edema is appreciated.  The TMs are intact without fluid. Nose: External evaluation reveals normal support and skin without lesions.  Dorsum is intact.  Anterior rhinoscopy reveals congested mucosa over anterior aspect of inferior turbinates and deviated septum.  No purulence noted. Oral:  Oral cavity and oropharynx are intact, symmetric, without erythema or edema.  Mucosa is moist without lesions. Neck: Full range of motion without pain.  There is no significant  lymphadenopathy.  No masses palpable.  Thyroid  bed within normal limits to palpation.  Parotid glands and submandibular glands equal bilaterally without mass.  Trachea is midline. Neuro:  CN 2-12 grossly intact.   Assessment: 1.  Chronic rhinitis with nasal mucosal congestion, nasal septal deviation, and bilateral inferior turbinate hypertrophy. 2.  History of recurrent sinusitis.  However, no acute infection is noted today.  Plan: 1.  The physical exam findings are reviewed with the patient. 2.  Continue with Flonase and Astepro nasal sprays. 3.  Nasal saline irrigation is also encouraged. 4.  The patient will return for reevaluation in 6 months, sooner if needed.

## 2024-03-11 ENCOUNTER — Ambulatory Visit (INDEPENDENT_AMBULATORY_CARE_PROVIDER_SITE_OTHER): Admitting: Otolaryngology

## 2024-03-11 ENCOUNTER — Encounter (INDEPENDENT_AMBULATORY_CARE_PROVIDER_SITE_OTHER): Payer: Self-pay | Admitting: Otolaryngology

## 2024-03-11 VITALS — BP 117/80 | HR 70

## 2024-03-11 DIAGNOSIS — J310 Chronic rhinitis: Secondary | ICD-10-CM

## 2024-03-11 DIAGNOSIS — J0191 Acute recurrent sinusitis, unspecified: Secondary | ICD-10-CM | POA: Diagnosis not present

## 2024-03-11 DIAGNOSIS — J343 Hypertrophy of nasal turbinates: Secondary | ICD-10-CM

## 2024-03-11 DIAGNOSIS — J342 Deviated nasal septum: Secondary | ICD-10-CM

## 2024-03-11 DIAGNOSIS — J0101 Acute recurrent maxillary sinusitis: Secondary | ICD-10-CM | POA: Insufficient documentation

## 2024-03-11 MED ORDER — LEVOFLOXACIN 500 MG PO TABS: 500.0000 mg | ORAL_TABLET | Freq: Every day | ORAL | 0 refills | Status: AC

## 2024-03-11 MED ORDER — PREDNISONE 10 MG (21) PO TBPK: ORAL_TABLET | ORAL | 0 refills | Status: AC

## 2024-03-11 NOTE — Progress Notes (Signed)
 Patient ID: Pamela Mathis, female   DOB: November 03, 1980, 44 y.o.   MRN: [id number]  CC: Acute sinusitis, nasal congestion  History of Present Illness Pamela Mathis is a 44 year old female with recurrent sinusitis who presents with acute recurrence of sinus symptoms.  Following an upper respiratory illness after Christmas, she experienced initial improvement but subsequently developed a recurrence of symptoms over the past several days.  She reports significant sinus pressure and pain, primarily localized to one side of the midface, with intermittent radiation across the forehead and midface. She also describes severe right otalgia without left ear involvement. She denies fever but endorses malaise.  She notes nasal congestion but maintains the ability to breathe through her nose. She denies pain at the vertex of the face. She denies diabetes and has no medication allergies.  She continues regular use of Flonase and Azedra. This is her first recurrence since her last visit.   Exam: General: Communicates without difficulty, well nourished, no acute distress. Head: Normocephalic, no evidence injury, no tenderness, facial buttresses intact without stepoff. Face/sinus: No tenderness to palpation and percussion. Facial movement is normal and symmetric. Eyes: PERRL, EOMI. No scleral icterus, conjunctivae clear. Neuro: CN II exam reveals vision grossly intact.  No nystagmus at any point of gaze. Ears: Auricles well formed without lesions.  Ear canals are intact without mass or lesion.  No erythema or edema is appreciated.  The TMs are intact without fluid. Nose: External evaluation reveals normal support and skin without lesions.  Dorsum is intact.  Anterior rhinoscopy reveals erythematous and edematous mucosa over anterior aspect of inferior turbinates and intact septum.  Oral:  Oral cavity and oropharynx are intact, symmetric, without erythema or edema.  Mucosa is moist without lesions. Neck: Full range of  motion without pain.  There is no significant lymphadenopathy.  No masses palpable.  Thyroid  bed within normal limits to palpation.  Parotid glands and submandibular glands equal bilaterally without mass.  Trachea is midline. Neuro:  CN 2-12 grossly intact.   Assessment & Plan Acute recurrent sinusitis She presented with an acute episode of sinusitis following an upper respiratory infection, characterized by recurrent sinus pressure, midface and forehead pain, and right ear pain. Examination demonstrated significant nasal mucosal inflammation and swelling. This is a new occurrence since her last visit. - Prescribed levofloxacin  750 mg once daily for 10 days for bacterial sinusitis; provided education regarding rare but serious adverse effects including tendon rupture and joint pain, and instructed her to discontinue the medication and contact the office if these symptoms occur. - Prescribed oral steroids to reduce mucosal swelling and pressure, with expectation of symptom resolution. - Advised continuation of current nasal medications (Flonase and Astepro). - Instructed to return for evaluation if symptoms persist beyond two weeks after completing antibiotics and steroids. - Scheduled follow-up in May, with instructions to return earlier if symptoms do not resolve.  Chronic rhinitis with nasal mucosal congestion, nasal septal deviation, and bilateral inferior turbinate hypertrophy Her chronic rhinitis remains well controlled with current therapy, with fewer episodes this year compared to last year. She continues to experience baseline nasal congestion without acute worsening outside of the current sinusitis episode. - Advised continuation of Flonase and Astepro nasal sprays as maintenance therapy for chronic rhinitis and congestion.

## 2024-03-17 ENCOUNTER — Other Ambulatory Visit (INDEPENDENT_AMBULATORY_CARE_PROVIDER_SITE_OTHER): Payer: Self-pay | Admitting: Otolaryngology

## 2024-03-17 ENCOUNTER — Telehealth (INDEPENDENT_AMBULATORY_CARE_PROVIDER_SITE_OTHER): Payer: Self-pay

## 2024-03-17 MED ORDER — AZITHROMYCIN 250 MG PO TABS: ORAL_TABLET | ORAL | 0 refills | Status: AC

## 2024-03-17 NOTE — Telephone Encounter (Signed)
 Patient called regarding side effects that she is having to the antibiotics Dr. Karis prescribed to her. She says that he told her to give us  a call if she has any side effects and that he would send in an alternative medication. Patient explains that the antibiotic is making her joints ache. Please advise.

## 2024-03-18 NOTE — Telephone Encounter (Signed)
 Called patient back and left her a voice mail letting her know,  that Dr. Karis called in a Z-pack at [facility].

## 2024-03-27 LAB — TSH: TSH: 2.78 u[IU]/mL (ref 0.450–4.500)

## 2024-03-27 LAB — T4, FREE: Free T4: 1.23 ng/dL (ref 0.82–1.77)

## 2024-03-27 LAB — PROLACTIN: Prolactin: 23.1 ng/mL (ref 4.8–33.4)

## 2024-03-31 ENCOUNTER — Encounter: Payer: Self-pay | Admitting: "Endocrinology

## 2024-03-31 ENCOUNTER — Ambulatory Visit: Admitting: "Endocrinology

## 2024-03-31 VITALS — BP 108/78 | HR 60 | Ht 66.0 in | Wt 278.4 lb

## 2024-03-31 DIAGNOSIS — E221 Hyperprolactinemia: Secondary | ICD-10-CM

## 2024-03-31 DIAGNOSIS — D352 Benign neoplasm of pituitary gland: Secondary | ICD-10-CM | POA: Diagnosis not present

## 2024-03-31 MED ORDER — CABERGOLINE 0.5 MG PO TABS: 0.2500 mg | ORAL_TABLET | ORAL | 1 refills | Status: AC

## 2024-03-31 NOTE — Progress Notes (Signed)
 "                                                     03/31/2024, 3:56 PM  Endocrinology follow-up note   Subjective:    Patient ID: Pamela Mathis, female    DOB: 09/25/1980, PCP Burdine, Elspeth BRAVO, MD   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Past Surgical History:  Procedure Laterality Date   BREAST BIOPSY Left 11/21/2023   US  LT BREAST BX W LOC DEV EA ADD LESION IMG BX SPEC US  GUIDE 11/21/2023 [facility]   BREAST BIOPSY Left 11/21/2023   US  LT BREAST BX W LOC DEV 1ST LESION IMG BX SPEC US  GUIDE 11/21/2023 [facility]   CESAREAN SECTION  12/19/2011   Procedure: CESAREAN SECTION;  Surgeon: Rosaline LITTIE Cobble, MD;  Location: [facility];  Service: Obstetrics;  Laterality: N/A;   CHOLECYSTECTOMY N/A 06/16/2013   Procedure: LAPAROSCOPIC CHOLECYSTECTOMY;  Surgeon: Oneil DELENA Budge, MD;  Location: [facility];  Service: General;  Laterality: N/A;   WISDOM TOOTH EXTRACTION     Social History   Socioeconomic History   Marital status: Married    Spouse name: Not on file   Number of children: Not on file   Years of education: Not on file   Highest education level: Not on file  Occupational History   Not on file  Tobacco Use   Smoking status: Never    Passive exposure: Past   Smokeless tobacco: Never  Vaping Use   Vaping status: Never Used  Substance and Sexual Activity   Alcohol use: No   Drug use: No   Sexual activity: Not on file  Other Topics Concern   Not on file  Social History Narrative   Not on file   Social Drivers of Health   Tobacco Use: Low Risk (03/31/2024)   Patient History    Smoking Tobacco Use: Never    Smokeless Tobacco Use: Never    Passive Exposure: Past  Financial Resource Strain: Not on file  Food Insecurity: Not on file  Transportation Needs: Not on file  Physical Activity: Not on file  Stress: Not on file  Social Connections:  Not on file  Depression (EYV7-0): Not on file  Alcohol Screen: Not on file  Housing: Not on file  Utilities: Not on file  Health Literacy: Not on file   Family History  Problem Relation Age of Onset   Asthma Mother    Eczema Father    Allergic rhinitis Sister    Other Neg Hx    Outpatient Encounter Medications as of 03/31/2024  Medication Sig   azelastine (ASTELIN) 0.1 % nasal spray Place into both nostrils 2 (two) times daily. Use in each nostril as directed   [START ON 04/01/2024] cabergoline  (DOSTINEX ) 0.5 MG tablet Take 0.5 tablets (0.25 mg total) by mouth 2 (two) times a week.   calcium carbonate (TUMS - DOSED IN MG ELEMENTAL CALCIUM) 500 MG chewable tablet Chew 2 tablets by mouth as needed. hearburn   cholecalciferol (VITAMIN D3) 25 MCG (1000 UNIT) tablet Take 1,000 Units by mouth daily.   fluticasone (FLONASE) 50 MCG/ACT nasal spray Place 1 spray into both nostrils  daily.   ibuprofen  (ADVIL ) 400 MG tablet Take 400 mg by mouth every 6 (six) hours as needed for mild pain (pain score 1-3) or moderate pain (pain score 4-6).   loratadine (CLARITIN REDITABS) 10 MG dissolvable tablet Take 10 mg by mouth daily.   Olopatadine-Mometasone (RYALTRIS ) 665-25 MCG/ACT SUSP Place 2 sprays into the nose 2 (two) times daily as needed. (Patient not taking: Reported on 03/11/2024)   ondansetron  (ZOFRAN -ODT) 4 MG disintegrating tablet Take 1 tablet (4 mg total) by mouth every 8 (eight) hours as needed for nausea or vomiting.   predniSONE  (STERAPRED UNI-PAK 21 TAB) 10 MG (21) TBPK tablet Per instructions. (Patient not taking: Reported on 03/11/2024)   predniSONE  (STERAPRED UNI-PAK 21 TAB) 10 MG (21) TBPK tablet Per instructions (6,5,4,3,2,1) (Patient not taking: Reported on 03/31/2024)   ZINC OXIDE-VITAMIN C PO Take 1 tablet by mouth daily. (Patient not taking: Reported on 03/11/2024)   [DISCONTINUED] cabergoline  (DOSTINEX ) 0.5 MG tablet Take 0.5 tablets (0.25 mg total) by mouth 2 (two) times a week.   No  facility-administered encounter medications on file as of 03/31/2024.   ALLERGIES: Allergies  Allergen Reactions   Dilaudid  [Hydromorphone ] Nausea And Vomiting   Grass Pollen(K-O-[id number]-T-Swt Vern)    Tape Rash    VACCINATION STATUS: There is no immunization history for the selected administration types on file for this patient.  HPI Pamela Mathis is 44 y.o. female who presents today with a medical history as above. she is being seen in follow-up after she was seen in consultation for medical history of hyperprolactinemia and pituitary microadenoma requested by Lari Elspeth BRAVO, MD.   She has documented history of pituitary microadenoma at least from July 2012 when she was found to have 9 mm of pituitary adenoma.  She was subsequently treated with cabergoline  for several years with progressive decrease in the size of the adenoma to 3 mm in 2014 and controlled prolactin level. She came off of cabergoline  to improve her fertility and reportedly never got back on it.  In 2015 MRI showed pituitary mass slowly growing to 5 mm.  Her most recent labs show elevation of prolactin to 242, normal IGF, normal thyroid  function test.  She previously had normal 24-hour urine free cortisol measurement. She was restarted on cabergoline  0.25 mg p.o. twice a week which she has responded with normalization of prolactin-see below. She denies visual field deficit, however reports progressive weight gain. She also did not have any menstrual cycles for at least a year.  She is not actively looking to keep her fertility at this time. Her medications include Astelin nasal spray, Tums, vitamin D, ibuprofen  as needed. She denies any family history of endocrine dysfunctions.  Review of Systems  Constitutional: + Mildly fluctuating body weight, no subjective hyperthermia, no subjective hypothermia Eyes: no blurry vision, no xerophthalmia   Objective:       03/31/2024    3:28 PM 03/11/2024    4:47 PM 01/07/2024     4:35 PM  Vitals with BMI  Height 5' 6  5' 6  Weight 278 lbs 6 oz  280 lbs  BMI 44.96  45.21  Systolic 108 117 888  Diastolic 78 80 77  Pulse 60 70 78    BP 108/78   Pulse 60   Ht 5' 6 (1.676 m)   Wt 278 lb 6.4 oz (126.3 kg)   BMI 44.93 kg/m   Wt Readings from Last 3 Encounters:  03/31/24 278 lb 6.4 oz (126.3 kg)  01/07/24  280 lb (127 kg)  12/30/23 281 lb 3.2 oz (127.6 kg)    Physical Exam  Constitutional:  Body mass index is 44.93 kg/m.,  not in acute distress, normal state of mind Eyes: PERRLA, EOMI, no exophthalmos   CMP ( most recent) CMP     Component Value Date/Time   NA 138 12/08/2023 0858   K 4.3 12/08/2023 0858   CL 101 12/08/2023 0858   CO2 22 12/08/2023 0858   GLUCOSE 98 12/08/2023 0858   GLUCOSE 114 (H) 10/17/2019 1623   BUN 11 12/08/2023 0858   CREATININE 0.73 12/08/2023 0858   CALCIUM 9.4 12/08/2023 0858   PROT 6.6 12/08/2023 0858   ALBUMIN 3.9 12/08/2023 0858   AST 16 12/08/2023 0858   ALT 29 12/08/2023 0858   ALKPHOS 114 12/08/2023 0858   BILITOT 0.5 12/08/2023 0858   EGFR 104 12/08/2023 0858   GFRNONAA >60 10/17/2019 1623     6/ 27,  2015 MRI brain IMPRESSION:  1. The small right inferior intra sellar hypo enhancing lesion  compatible with microadenoma has mildly enlarged since 2014; now 5  mm diameter versus 3 mm at that time.  2. Otherwise stable pituitary with no other abnormality identified.  3. No new intracranial abnormality.   July 2020 brain MRI done for separate condition did not comment on pituitary.   Recent Results (from the past 2160 hours)  Prolactin     Status: None   Collection Time: 03/26/24  1:41 PM  Result Value Ref Range   Prolactin 23.1 4.8 - 33.4 ng/mL  TSH     Status: None   Collection Time: 03/26/24  1:41 PM  Result Value Ref Range   TSH 2.780 0.450 - 4.500 uIU/mL  T4, free     Status: None   Collection Time: 03/26/24  1:41 PM  Result Value Ref Range   Free T4 1.23 0.82 - 1.77 ng/dL    Assessment  & Plan:   1. Pituitary microadenoma (HCC) (Primary) 2.  Hyperprolactinemia  -I have reviewed her new and existing labs, presents with controlled prolactin 23.1 improving from 242.   She would benefit from staying on the same dose of cabergoline  at 0.25 mg twice a week with plan to measure her prolactin in 6 months. She does have favorable IGF, thyroid  function test, and lipid panel. In light of her marginal 24-hour urine cortisol, she will be considered for repeat 24-hour urine study for free cortisol after her next visit. She will not need MRI of the pituitary at this time.   - she is advised to maintain close follow up with Burdine, Steven E, MD for primary care needs.   I spent  18  minutes in the care of the patient today including review of labs from Thyroid  Function, CMP, and other relevant labs ; imaging/biopsy records (current and previous including abstractions from other facilities); face-to-face time discussing  her lab results and symptoms, medications doses, her options of short and long term treatment based on the latest standards of care / guidelines;   and documenting the encounter.  Pamela Mathis  participated in the discussions, expressed understanding, and voiced agreement with the above plans.  All questions were answered to her satisfaction. she is encouraged to contact clinic should she have any questions or concerns prior to her return visit. Dear Patient: Feel free to review your progress notes.  If you are reviewing this progress note and have questions about the meaning of /or medical terms being  used, please make a note and address it at your next follow-up appointment.  Medical notes are meant to be a communication tool between medical professionals and require medical terms to be used for efficiency and insurance approval.   Follow up plan: Return in about 6 months (around 09/28/2024) for F/U with Pre-visit Labs.   Ranny Earl, MD [facility] [address] [place], [place] [address] Phone: [phone]  Fax: [phone]     03/31/2024, 3:56 PM  This note was partially dictated with voice recognition software. Similar sounding words can be transcribed inadequately or may not  be corrected upon review.  "

## 2024-07-07 ENCOUNTER — Ambulatory Visit (INDEPENDENT_AMBULATORY_CARE_PROVIDER_SITE_OTHER): Admitting: Otolaryngology

## 2024-09-28 ENCOUNTER — Ambulatory Visit: Admitting: "Endocrinology
